# Patient Record
Sex: Female | Born: 1975 | Race: Black or African American | Hispanic: No | Marital: Single | Smoking: Current every day smoker
Health system: Southern US, Community
[De-identification: ages and names within clinical notes are randomized; demographics above are authoritative.]

## PROBLEM LIST (undated history)

## (undated) DIAGNOSIS — E119 Type 2 diabetes mellitus without complications: Secondary | ICD-10-CM

## (undated) DIAGNOSIS — I10 Essential (primary) hypertension: Secondary | ICD-10-CM

## (undated) DIAGNOSIS — K219 Gastro-esophageal reflux disease without esophagitis: Secondary | ICD-10-CM

## (undated) HISTORY — DX: Type 2 diabetes mellitus without complications: E11.9

---

## 2013-04-24 DIAGNOSIS — Z3490 Encounter for supervision of normal pregnancy, unspecified, unspecified trimester: Secondary | ICD-10-CM | POA: Insufficient documentation

## 2016-02-07 ENCOUNTER — Emergency Department (HOSPITAL_BASED_OUTPATIENT_CLINIC_OR_DEPARTMENT_OTHER)
Admission: EM | Admit: 2016-02-07 | Discharge: 2016-02-07 | Disposition: A | Discharge status: Home / Self Care | Payer: Managed Care, Other (non HMO) | Attending: Emergency Medicine | Admitting: Emergency Medicine

## 2016-02-07 ENCOUNTER — Encounter (HOSPITAL_BASED_OUTPATIENT_CLINIC_OR_DEPARTMENT_OTHER): Payer: Self-pay | Admitting: *Deleted

## 2016-02-07 DIAGNOSIS — R05 Cough: Secondary | ICD-10-CM | POA: Diagnosis present

## 2016-02-07 DIAGNOSIS — B9789 Other viral agents as the cause of diseases classified elsewhere: Secondary | ICD-10-CM

## 2016-02-07 DIAGNOSIS — J069 Acute upper respiratory infection, unspecified: Secondary | ICD-10-CM | POA: Diagnosis not present

## 2016-02-07 DIAGNOSIS — F17210 Nicotine dependence, cigarettes, uncomplicated: Secondary | ICD-10-CM | POA: Insufficient documentation

## 2016-02-07 MED ORDER — DM-GUAIFENESIN ER 30-600 MG PO TB12: 1.0000 | ORAL_TABLET | Freq: Two times a day (BID) | ORAL | 0 refills | Status: DC

## 2016-02-07 NOTE — ED Triage Notes (Signed)
Pt c/o URi symptoms x 3 days family at home with same

## 2016-02-07 NOTE — ED Provider Notes (Signed)
[facility]   CSN: [id number] Arrival date & time: 02/07/16  1814  By signing my name below, I, Majel HomerPeyton Lee, attest that this documentation has been prepared under the direction and in the presence of non-physician practitioner, Audry Piliyler Laprecious Austill, PA-C. Electronically Signed: Majel HomerPeyton Lee, Scribe. 02/07/2016. 8:48 PM.  History   Chief Complaint Chief Complaint  Patient presents with  . URI   The history is provided by the patient. No language interpreter was used.   HPI Comments: Kathleen Cabrera is a 40 y.o. female who presents to the Emergency Department complaining of gradually worsening, dry cough and congestion that began a few days ago. Pt reports she has not taken any medication to relieve her symptoms. She denies sore throat and fever.   History reviewed. No pertinent past medical history.  There are no active problems to display for this patient.  Past Surgical History:  Procedure Laterality Date  . CESAREAN SECTION      OB History    No data available     Home Medications    Prior to Admission medications   Not on File    Family History No family history on file.  Social History Social History  Substance Use Topics  . Smoking status: Current Every Day Smoker    Packs/day: 0.50    Types: Cigarettes  . Smokeless tobacco: Not on file  . Alcohol use No     Allergies   Patient has no known allergies.   Review of Systems Review of Systems  Constitutional: Negative for fever.  HENT: Positive for congestion. Negative for sore throat.   Respiratory: Positive for cough.    Physical Exam Updated Vital Signs BP (!) 163/107 (BP Location: Left Arm)   Pulse 97   Temp 98.6 F (37 C) (Oral)   Resp 18   Ht 5\' 3"  (1.6 m)   Wt 190 lb (86.2 kg)   SpO2 97%   BMI 33.66 kg/m   Physical Exam  Constitutional: She is oriented to person, place, and time. She appears well-developed and well-nourished. No distress.  HENT:  Head: Normocephalic  and atraumatic.  Right Ear: Tympanic membrane, external ear and ear canal normal.  Left Ear: Tympanic membrane, external ear and ear canal normal.  Nose: Nose normal.  Mouth/Throat: Uvula is midline, oropharynx is clear and moist and mucous membranes are normal. No trismus in the jaw. No oropharyngeal exudate, posterior oropharyngeal erythema or tonsillar abscesses.  Eyes: EOM are normal. Pupils are equal, round, and reactive to light.  Neck: Normal range of motion. Neck supple. No tracheal deviation present.  Cardiovascular: Normal rate, regular rhythm, S1 normal, S2 normal, normal heart sounds, intact distal pulses and normal pulses.   Pulmonary/Chest: Effort normal and breath sounds normal. No respiratory distress. She has no decreased breath sounds. She has no wheezes. She has no rhonchi. She has no rales.  Abdominal: Normal appearance and bowel sounds are normal. She exhibits no distension. There is no tenderness.  Musculoskeletal: Normal range of motion.  Neurological: She is alert and oriented to person, place, and time.  Skin: Skin is warm and dry.  Psychiatric: She has a normal mood and affect. Her speech is normal and behavior is normal. Thought content normal.  Nursing note and vitals reviewed.  ED Treatments / Results  Labs (all labs ordered are listed, but only abnormal results are displayed) Labs Reviewed - No data to display  EKG  EKG Interpretation None  Radiology No results found.  Procedures Procedures (including critical care time)  Medications Ordered in ED Medications - No data to display  DIAGNOSTIC STUDIES:  Oxygen Saturation is 97% on RA, normal by my interpretation.    COORDINATION OF CARE:  8:47 PM Discussed treatment plan with pt at bedside and pt agreed to plan.  Initial Impression / Assessment and Plan / ED Course  I have reviewed the triage vital signs and the nursing notes.  Pertinent labs & imaging results that were available during  my care of the patient were reviewed by me and considered in my medical decision making (see chart for details).  Clinical Course     Final Clinical Impressions(s) / ED Diagnoses   I obtained HPI from historian.  ED Course:  Assessment: Pt is a 40yM presents with URI symptoms x 2-3 days. Son here with same . On exam, pt in NAD. VSS. Afebrile. Lungs CTA, Heart RRR. Abdomen nontender/soft. Patients symptoms are consistent with URI, likely viral etiology. Discussed that antibiotics are not indicated for viral infections. Pt will be discharged with symptomatic treatment.  Verbalizes understanding and is agreeable with plan. Pt is hemodynamically stable & in NAD prior to dc.  Disposition/Plan:  DC Home Additional Verbal discharge instructions given and discussed with patient.  Pt Instructed to f/u with PCP in the next week for evaluation and treatment of symptoms. Return precautions given Pt acknowledges and agrees with plan  Supervising Physician Lyndal Pulleyaniel Knott, MD  Final diagnoses:  Viral URI with cough    New Prescriptions New Prescriptions   No medications on file     Audry Piliyler Peola Joynt, PA-C 02/07/16 2054    Lyndal Pulleyaniel Knott, MD 02/08/16 [phone]

## 2016-02-07 NOTE — Discharge Instructions (Signed)
Please read and follow all provided instructions.  Your diagnoses today include:  1. Viral URI with cough    You appear to have an upper respiratory infection (URI). An upper respiratory tract infection, or cold, is a viral infection of the air passages leading to the lungs. It should improve gradually after 5-7 days. You may have a lingering cough that lasts for 2- 4 weeks after the infection.  Tests performed today include: Vital signs. See below for your results today.   Medications prescribed:   Take any prescribed medications only as directed. Treatment for your infection is aimed at treating the symptoms. There are no medications, such as antibiotics, that will cure your infection.   Home care instructions:  Follow any educational materials contained in this packet.   Your illness is contagious and can be spread to others, especially during the first 3 or 4 days. It cannot be cured by antibiotics or other medicines. Take basic precautions such as washing your hands often, covering your mouth when you cough or sneeze, and avoiding public places where you could spread your illness to others.   Please continue drinking plenty of fluids.  Use over-the-counter medicines as needed as directed on packaging for symptom relief.  You may also use ibuprofen or tylenol as directed on packaging for pain or fever.  Do not take multiple medicines containing Tylenol or acetaminophen to avoid taking too much of this medication.  Follow-up instructions: Please follow-up with your primary care provider in the next 3 days for further evaluation of your symptoms if you are not feeling better.   Return instructions:  Please return to the Emergency Department if you experience worsening symptoms.  RETURN IMMEDIATELY IF you develop shortness of breath, confusion or altered mental status, a new rash, become dizzy, faint, or poorly responsive, or are unable to be cared for at home. Please return if you have  persistent vomiting and cannot keep down fluids or develop a fever that is not controlled by tylenol or motrin.   Please return if you have any other emergent concerns.  Additional Information:  Your vital signs today were: BP (!) 163/107 (BP Location: Left Arm)    Pulse 97    Temp 98.6 F (37 C) (Oral)    Resp 18    Ht 5\' 3"  (1.6 m)    Wt 86.2 kg    SpO2 97%    BMI 33.66 kg/m  If your blood pressure (BP) was elevated above 135/85 this visit, please have this repeated by your doctor within one month. --------------

## 2017-12-08 ENCOUNTER — Emergency Department (HOSPITAL_BASED_OUTPATIENT_CLINIC_OR_DEPARTMENT_OTHER)
Admission: EM | Admit: 2017-12-08 | Discharge: 2017-12-08 | Disposition: A | Discharge status: Home / Self Care | Payer: Managed Care, Other (non HMO) | Attending: Emergency Medicine | Admitting: Emergency Medicine

## 2017-12-08 ENCOUNTER — Other Ambulatory Visit: Payer: Self-pay

## 2017-12-08 ENCOUNTER — Encounter (HOSPITAL_BASED_OUTPATIENT_CLINIC_OR_DEPARTMENT_OTHER): Payer: Self-pay | Admitting: *Deleted

## 2017-12-08 ENCOUNTER — Emergency Department (HOSPITAL_BASED_OUTPATIENT_CLINIC_OR_DEPARTMENT_OTHER): Payer: Managed Care, Other (non HMO)

## 2017-12-08 DIAGNOSIS — F17210 Nicotine dependence, cigarettes, uncomplicated: Secondary | ICD-10-CM | POA: Insufficient documentation

## 2017-12-08 DIAGNOSIS — R0602 Shortness of breath: Secondary | ICD-10-CM | POA: Diagnosis present

## 2017-12-08 DIAGNOSIS — I10 Essential (primary) hypertension: Secondary | ICD-10-CM | POA: Insufficient documentation

## 2017-12-08 HISTORY — DX: Essential (primary) hypertension: I10

## 2017-12-08 LAB — URINALYSIS, ROUTINE W REFLEX MICROSCOPIC
Bilirubin Urine: NEGATIVE
GLUCOSE, UA: NEGATIVE mg/dL
HGB URINE DIPSTICK: NEGATIVE
Ketones, ur: NEGATIVE mg/dL
Leukocytes, UA: NEGATIVE
Nitrite: NEGATIVE
PROTEIN: NEGATIVE mg/dL
Specific Gravity, Urine: <1.005 — ABNORMAL LOW (ref 1.005–1.030)
pH: 7 (ref 5.0–8.0)

## 2017-12-08 LAB — CBC WITH DIFFERENTIAL/PLATELET
ABS IMMATURE GRANULOCYTES: 0.03 10*3/uL (ref 0.00–0.07)
BASOS PCT: 1 %
Basophils Absolute: 0.1 10*3/uL (ref 0.0–0.1)
EOS ABS: 0.3 10*3/uL (ref 0.0–0.5)
Eosinophils Relative: 3 %
HCT: 41.6 % (ref 36.0–46.0)
Hemoglobin: 14.3 g/dL (ref 12.0–15.0)
Immature Granulocytes: 0 %
Lymphocytes Relative: 46 %
Lymphs Abs: 4.6 10*3/uL — ABNORMAL HIGH (ref 0.7–4.0)
MCH: 34 pg (ref 26.0–34.0)
MCHC: 34.4 g/dL (ref 30.0–36.0)
MCV: 99 fL (ref 80.0–100.0)
MONO ABS: 0.5 10*3/uL (ref 0.1–1.0)
MONOS PCT: 5 %
NEUTROS ABS: 4.5 10*3/uL (ref 1.7–7.7)
Neutrophils Relative %: 45 %
PLATELETS: 312 10*3/uL (ref 150–400)
RBC: 4.2 MIL/uL (ref 3.87–5.11)
RDW: 13.2 % (ref 11.5–15.5)
WBC: 9.9 10*3/uL (ref 4.0–10.5)
nRBC: 0 % (ref 0.0–0.2)

## 2017-12-08 LAB — BASIC METABOLIC PANEL
ANION GAP: 13 (ref 5–15)
BUN: 8 mg/dL (ref 6–20)
CALCIUM: 9 mg/dL (ref 8.9–10.3)
CO2: 18 mmol/L — ABNORMAL LOW (ref 22–32)
Chloride: 104 mmol/L (ref 98–111)
Creatinine, Ser: 1.14 mg/dL — ABNORMAL HIGH (ref 0.44–1.00)
GFR calc Af Amer: >60 mL/min (ref >60)
GFR, EST NON AFRICAN AMERICAN: 58 mL/min — AB (ref >60)
GLUCOSE: 113 mg/dL — AB (ref 70–99)
POTASSIUM: 3 mmol/L — AB (ref 3.5–5.1)
SODIUM: 135 mmol/L (ref 135–145)

## 2017-12-08 LAB — PREGNANCY, URINE: PREG TEST UR: NEGATIVE

## 2017-12-08 MED ORDER — AMLODIPINE BESYLATE 10 MG PO TABS: 10.0000 mg | ORAL_TABLET | Freq: Every day | ORAL | 0 refills | Status: AC

## 2017-12-08 MED ORDER — LOSARTAN POTASSIUM 25 MG PO TABS: 25.0000 mg | ORAL_TABLET | Freq: Every day | ORAL | 0 refills | Status: AC

## 2017-12-08 MED ORDER — POTASSIUM CHLORIDE CRYS ER 20 MEQ PO TBCR
40.0000 meq | EXTENDED_RELEASE_TABLET | Freq: Once | ORAL | Status: AC
  Administered 2017-12-08: 40 meq via ORAL
  Filled 2017-12-08: qty 2

## 2017-12-08 NOTE — Discharge Instructions (Signed)
Take your blood pressure medications as prescribed. Schedule an appointment with your primary care provider to follow-up on your visit today.  It is also recommended you recheck your kidney function with your primary care doctor. Return to the ER for severe headache, vision changes, chest pain, worsening shortness of breath, or new or concerning symptoms.

## 2017-12-08 NOTE — ED Triage Notes (Signed)
Pt reports feeling SOB and dizzy. States "I can't catch my breath". Reports heart beating fast earlier. Tearful in triage. States she has been out of her BP meds for a while

## 2017-12-08 NOTE — ED Provider Notes (Addendum)
[facility] EMERGENCY DEPARTMENT Provider Note   CSN: [id number] Arrival date & time: 12/08/17  1830     History   Chief Complaint Chief Complaint  Patient presents with  . Shortness of Breath    HPI Kathleen Cabrera is a 42 y.o. female w PMHx HTN, presenting to the ED with complaint of SOB and dizziness. She states today she has been feeling intermittently lightheaded as though she may pass out. This occurs at rest. She states she also has had intermittent episodes of SOB where she feels like she can't catch her breath. She reports assoc palpitations and sometimes seeing "spots." States she has not taken BP medications in multiple months. Denies HA, diplopia or blurry vision, chest pain, cough, hemoptysis, abd pain, hx DVT/PE, LE swelling, recent travel or surgery. Gets depo shot, though no exogenous estrogens.   The history is provided by the patient.    Past Medical History:  Diagnosis Date  . Hypertension     There are no active problems to display for this patient.   Past Surgical History:  Procedure Laterality Date  . CESAREAN SECTION    . CESAREAN SECTION       OB History   None      Home Medications    Prior to Admission medications   Medication Sig Start Date End Date Taking? Authorizing Provider  amLODipine (NORVASC) 10 MG tablet Take 1 tablet (10 mg total) by mouth daily. 12/08/17   Robinson, [place] N, PA-C  dextromethorphan-guaiFENesin [facility] DM) 30-600 MG 12hr tablet Take 1 tablet by mouth 2 (two) times daily. 02/07/16   Audry Pili, PA-C  losartan (COZAAR) 25 MG tablet Take 1 tablet (25 mg total) by mouth daily. 12/08/17   Robinson, [place] N, PA-C    Family History No family history on file.  Social History Social History   Tobacco Use  . Smoking status: Current Every Day Smoker    Packs/day: 0.50    Types: Cigarettes  . Smokeless tobacco: Never Used  Substance Use Topics  . Alcohol use: Yes    Comment: weekly  . Drug use: No      Allergies   Patient has no known allergies.   Review of Systems Review of Systems  Constitutional: Negative for fever.  Respiratory: Positive for shortness of breath. Negative for cough.   Cardiovascular: Positive for palpitations. Negative for chest pain and leg swelling.  Gastrointestinal: Negative for abdominal pain, nausea and vomiting.  Neurological: Positive for light-headedness. Negative for headaches.  All other systems reviewed and are negative.    Physical Exam Updated Vital Signs BP (!) 173/115   Pulse 68   Resp 16   SpO2 100%   Physical Exam  Constitutional: She is oriented to person, place, and time. She appears well-developed and well-nourished.  HENT:  Head: Normocephalic and atraumatic.  Eyes: Pupils are equal, round, and reactive to light. Conjunctivae and EOM are normal.  Neck: Normal range of motion. Neck supple. No JVD present.  Cardiovascular: Normal rate, regular rhythm, normal heart sounds and intact distal pulses.  Pulmonary/Chest: Effort normal and breath sounds normal. No respiratory distress.  Abdominal: Soft. Bowel sounds are normal. She exhibits no distension and no mass. There is no tenderness. There is no guarding.  Musculoskeletal:  No LE edema.  Neurological: She is alert and oriented to person, place, and time.  Mental Status:  Alert, oriented, thought content appropriate, able to give a coherent history. Speech fluent without evidence of aphasia. Able to  follow 2 step commands without difficulty.  Cranial Nerves:  II:  Peripheral visual fields grossly normal, pupils equal, round, reactive to light III,IV, VI: ptosis not present, extra-ocular motions intact bilaterally  V,VII: smile symmetric, facial light touch sensation equal VIII: hearing grossly normal to voice  X: uvula elevates symmetrically  XI: bilateral shoulder shrug symmetric and strong XII: midline tongue extension without fassiculations Motor:  Normal tone. 5/5 in  upper and lower extremities bilaterally including strong and equal grip strength and dorsiflexion/plantar flexion Sensory: Pinprick and light touch normal in all extremities.  Deep Tendon Reflexes: 2+ and symmetric in the biceps and patella Cerebellar: normal finger-to-nose with bilateral upper extremities Gait: normal gait and balance CV: distal pulses palpable throughout    Skin: Skin is warm. She is not diaphoretic.  Psychiatric: She has a normal mood and affect. Her behavior is normal.  Nursing note and vitals reviewed.    ED Treatments / Results  Labs (all labs ordered are listed, but only abnormal results are displayed) Labs Reviewed  BASIC METABOLIC PANEL - Abnormal; Notable for the following components:      Result Value   Potassium 3.0 (*)    CO2 18 (*)    Glucose, Bld 113 (*)    Creatinine, Ser 1.14 (*)    GFR calc non Af Amer 58 (*)    All other components within normal limits  CBC WITH DIFFERENTIAL/PLATELET - Abnormal; Notable for the following components:   Lymphs Abs 4.6 (*)    All other components within normal limits  URINALYSIS, ROUTINE W REFLEX MICROSCOPIC - Abnormal; Notable for the following components:   Color, Urine COLORLESS (*)    Specific Gravity, Urine <1.005 (*)    All other components within normal limits  PREGNANCY, URINE    EKG EKG Interpretation  Date/Time:  Sunday December 08 2017 18:43:48 EDT Ventricular Rate:  73 PR Interval:    QRS Duration: 86 QT Interval:  380 QTC Calculation: 419 R Axis:   26 Text Interpretation:  Sinus rhythm Baseline wander in lead(s) I III aVL No old tracing to compare Confirmed by Loren Racer ([id number]) on 12/08/2017 8:43:56 PM   Radiology Dg Chest 2 View  Result Date: 12/08/2017 CLINICAL DATA:  Shortness of breath for 6 months. EXAM: CHEST - 2 VIEW COMPARISON:  None. FINDINGS: The heart size and mediastinal contours are within normal limits. Both lungs are clear. The visualized skeletal structures are  unremarkable. IMPRESSION: No active cardiopulmonary disease. Electronically Signed   By: Gerome Sam III M.D   On: 12/08/2017 20:00    Procedures Procedures (including critical care time)  Medications Ordered in ED Medications  potassium chloride SA (K-DUR,KLOR-CON) CR tablet 40 mEq (40 mEq Oral Given 12/08/17 2031)     Initial Impression / Assessment and Plan / ED Course  I have reviewed the triage vital signs and the nursing notes.  Pertinent labs & imaging results that were available during my care of the patient were reviewed by me and considered in my medical decision making (see chart for details).     Patient presenting with intermittent SOB and lightheadedness today. Noncompliant with BP medications. On arrival, pt noted to be anxious and hypertensive. No CP, HA, vision changes. Normal neuro exam. Lungs clear, normal work of breathing, O2 sat 100% on RA. Labs with mild hypokalemia, no EKG changes. Cr is mildly elevated, though patient's baseline is unknown. CBC unremarkable. U/A neg. Urine preg neg. CXR neg. PERC negative. BP improved in ED without  intervention and patient reporting improvement. Potassium replaced and recommend diet modifications to increase potassium. No signs of end organ damage. Discussed with patient the need for close follow-up and management by their primary care physician, and creatinine recheck. Well-appearing prior to discharge, and is agreeable to plan. Rx for BP medications given.   Patient discussed with Dr. Ranae Palms who agrees with care plan and discharge.  Discussed results, findings, treatment and follow up. Patient advised of return precautions. Patient verbalized understanding and agreed with plan.   Final Clinical Impressions(s) / ED Diagnoses   Final diagnoses:  Hypertension, unspecified type    ED Discharge Orders         Ordered    amLODipine (NORVASC) 10 MG tablet  Daily     12/08/17 2023    losartan (COZAAR) 25 MG tablet  Daily      12/08/17 2023           Robinson, [place] N, PA-C 12/08/17 2047    Robinson, [place] N, PA-C 12/08/17 2049    Loren Racer, MD 12/08/17 2330

## 2018-08-17 ENCOUNTER — Emergency Department (HOSPITAL_BASED_OUTPATIENT_CLINIC_OR_DEPARTMENT_OTHER): Payer: Managed Care, Other (non HMO)

## 2018-08-17 ENCOUNTER — Other Ambulatory Visit: Payer: Self-pay

## 2018-08-17 ENCOUNTER — Inpatient Hospital Stay (HOSPITAL_BASED_OUTPATIENT_CLINIC_OR_DEPARTMENT_OTHER)
Admission: EM | Admit: 2018-08-17 | Discharge: 2018-08-21 | DRG: 637 | Disposition: A | Discharge status: Home / Self Care | Payer: Managed Care, Other (non HMO) | Attending: Internal Medicine | Admitting: Internal Medicine

## 2018-08-17 ENCOUNTER — Encounter (HOSPITAL_BASED_OUTPATIENT_CLINIC_OR_DEPARTMENT_OTHER): Payer: Self-pay | Admitting: Emergency Medicine

## 2018-08-17 DIAGNOSIS — F10230 Alcohol dependence with withdrawal, uncomplicated: Secondary | ICD-10-CM | POA: Diagnosis present

## 2018-08-17 DIAGNOSIS — F17210 Nicotine dependence, cigarettes, uncomplicated: Secondary | ICD-10-CM | POA: Diagnosis present

## 2018-08-17 DIAGNOSIS — K529 Noninfective gastroenteritis and colitis, unspecified: Secondary | ICD-10-CM | POA: Diagnosis present

## 2018-08-17 DIAGNOSIS — E1110 Type 2 diabetes mellitus with ketoacidosis without coma: Secondary | ICD-10-CM | POA: Diagnosis present

## 2018-08-17 DIAGNOSIS — I10 Essential (primary) hypertension: Secondary | ICD-10-CM | POA: Diagnosis present

## 2018-08-17 DIAGNOSIS — R112 Nausea with vomiting, unspecified: Secondary | ICD-10-CM

## 2018-08-17 DIAGNOSIS — E876 Hypokalemia: Secondary | ICD-10-CM | POA: Diagnosis present

## 2018-08-17 DIAGNOSIS — K852 Alcohol induced acute pancreatitis without necrosis or infection: Secondary | ICD-10-CM | POA: Diagnosis present

## 2018-08-17 DIAGNOSIS — R739 Hyperglycemia, unspecified: Secondary | ICD-10-CM

## 2018-08-17 DIAGNOSIS — R197 Diarrhea, unspecified: Secondary | ICD-10-CM

## 2018-08-17 DIAGNOSIS — E869 Volume depletion, unspecified: Secondary | ICD-10-CM | POA: Diagnosis present

## 2018-08-17 DIAGNOSIS — K859 Acute pancreatitis without necrosis or infection, unspecified: Secondary | ICD-10-CM

## 2018-08-17 DIAGNOSIS — E119 Type 2 diabetes mellitus without complications: Secondary | ICD-10-CM

## 2018-08-17 DIAGNOSIS — N179 Acute kidney failure, unspecified: Secondary | ICD-10-CM | POA: Diagnosis present

## 2018-08-17 DIAGNOSIS — K219 Gastro-esophageal reflux disease without esophagitis: Secondary | ICD-10-CM | POA: Diagnosis present

## 2018-08-17 DIAGNOSIS — Z20828 Contact with and (suspected) exposure to other viral communicable diseases: Secondary | ICD-10-CM | POA: Diagnosis present

## 2018-08-17 DIAGNOSIS — F10930 Alcohol use, unspecified with withdrawal, uncomplicated: Secondary | ICD-10-CM

## 2018-08-17 HISTORY — DX: Gastro-esophageal reflux disease without esophagitis: K21.9

## 2018-08-17 LAB — POCT I-STAT EG7
Acid-base deficit: 22 mmol/L — ABNORMAL HIGH (ref 0.0–2.0)
Bicarbonate: 6.2 mmol/L — ABNORMAL LOW (ref 20.0–28.0)
Calcium, Ion: 1.22 mmol/L (ref 1.15–1.40)
HCT: 49 % — ABNORMAL HIGH (ref 36.0–46.0)
Hemoglobin: 16.7 g/dL — ABNORMAL HIGH (ref 12.0–15.0)
O2 Saturation: 47 %
Patient temperature: 98.6
Potassium: 5.3 mmol/L — ABNORMAL HIGH (ref 3.5–5.1)
Sodium: 139 mmol/L (ref 135–145)
TCO2: 7 mmol/L — ABNORMAL LOW (ref 22–32)
pCO2, Ven: 20.1 mmHg — ABNORMAL LOW (ref 44.0–60.0)
pH, Ven: 7.096 — CL (ref 7.250–7.430)
pO2, Ven: 34 mmHg (ref 32.0–45.0)

## 2018-08-17 LAB — CBG MONITORING, ED
Glucose-Capillary: 562 mg/dL (ref 70–99)
Glucose-Capillary: 442 mg/dL — ABNORMAL HIGH (ref 70–99)
Glucose-Capillary: 410 mg/dL — ABNORMAL HIGH (ref 70–99)
Glucose-Capillary: 236 mg/dL — ABNORMAL HIGH (ref 70–99)
Glucose-Capillary: 222 mg/dL — ABNORMAL HIGH (ref 70–99)

## 2018-08-17 LAB — MAGNESIUM: Magnesium: 2.3 mg/dL (ref 1.7–2.4)

## 2018-08-17 LAB — BASIC METABOLIC PANEL
Anion gap: 14 (ref 5–15)
BUN: 11 mg/dL (ref 6–20)
CO2: 10 mmol/L — ABNORMAL LOW (ref 22–32)
Calcium: 9.4 mg/dL (ref 8.9–10.3)
Chloride: 120 mmol/L — ABNORMAL HIGH (ref 98–111)
Creatinine, Ser: 0.98 mg/dL (ref 0.44–1.00)
GFR calc Af Amer: >60 mL/min (ref >60)
GFR calc non Af Amer: >60 mL/min (ref >60)
Glucose, Bld: 172 mg/dL — ABNORMAL HIGH (ref 70–99)
Potassium: 4.2 mmol/L (ref 3.5–5.1)
Sodium: 144 mmol/L (ref 135–145)

## 2018-08-17 LAB — PREGNANCY, URINE: Preg Test, Ur: NEGATIVE

## 2018-08-17 LAB — PHOSPHORUS: Phosphorus: 1.5 mg/dL — ABNORMAL LOW (ref 2.5–4.6)

## 2018-08-17 LAB — URINALYSIS, MICROSCOPIC (REFLEX)

## 2018-08-17 LAB — LACTIC ACID, PLASMA
Lactic Acid, Venous: 2.2 mmol/L (ref 0.5–1.9)
Lactic Acid, Venous: 1.9 mmol/L (ref 0.5–1.9)

## 2018-08-17 LAB — GLUCOSE, CAPILLARY
Glucose-Capillary: 162 mg/dL — ABNORMAL HIGH (ref 70–99)
Glucose-Capillary: 164 mg/dL — ABNORMAL HIGH (ref 70–99)
Glucose-Capillary: 170 mg/dL — ABNORMAL HIGH (ref 70–99)
Glucose-Capillary: 236 mg/dL — ABNORMAL HIGH (ref 70–99)

## 2018-08-17 LAB — CBC
HCT: 49.7 % — ABNORMAL HIGH (ref 36.0–46.0)
HCT: 45.6 % (ref 36.0–46.0)
Hemoglobin: 16.1 g/dL — ABNORMAL HIGH (ref 12.0–15.0)
Hemoglobin: 15.4 g/dL — ABNORMAL HIGH (ref 12.0–15.0)
MCH: 34.5 pg — ABNORMAL HIGH (ref 26.0–34.0)
MCH: 35.3 pg — ABNORMAL HIGH (ref 26.0–34.0)
MCHC: 32.4 g/dL (ref 30.0–36.0)
MCHC: 33.8 g/dL (ref 30.0–36.0)
MCV: 106.4 fL — ABNORMAL HIGH (ref 80.0–100.0)
MCV: 104.6 fL — ABNORMAL HIGH (ref 80.0–100.0)
Platelets: 299 10*3/uL (ref 150–400)
Platelets: 249 10*3/uL (ref 150–400)
RBC: 4.67 MIL/uL (ref 3.87–5.11)
RBC: 4.36 MIL/uL (ref 3.87–5.11)
RDW: 13.1 % (ref 11.5–15.5)
RDW: 13.1 % (ref 11.5–15.5)
WBC: 15.4 10*3/uL — ABNORMAL HIGH (ref 4.0–10.5)
WBC: 16.2 10*3/uL — ABNORMAL HIGH (ref 4.0–10.5)
nRBC: 0 % (ref 0.0–0.2)
nRBC: 0 % (ref 0.0–0.2)

## 2018-08-17 LAB — COMPREHENSIVE METABOLIC PANEL
ALT: 94 U/L — ABNORMAL HIGH (ref 0–44)
AST: 60 U/L — ABNORMAL HIGH (ref 15–41)
Albumin: 5.2 g/dL — ABNORMAL HIGH (ref 3.5–5.0)
Alkaline Phosphatase: 152 U/L — ABNORMAL HIGH (ref 38–126)
Anion gap: 25 — ABNORMAL HIGH (ref 5–15)
BUN: 22 mg/dL — ABNORMAL HIGH (ref 6–20)
CO2: 7 mmol/L — ABNORMAL LOW (ref 22–32)
Calcium: 10.7 mg/dL — ABNORMAL HIGH (ref 8.9–10.3)
Chloride: 101 mmol/L (ref 98–111)
Creatinine, Ser: 1.77 mg/dL — ABNORMAL HIGH (ref 0.44–1.00)
GFR calc Af Amer: 40 mL/min — ABNORMAL LOW (ref >60)
GFR calc non Af Amer: 35 mL/min — ABNORMAL LOW (ref >60)
Glucose, Bld: 751 mg/dL (ref 70–99)
Potassium: 5.4 mmol/L — ABNORMAL HIGH (ref 3.5–5.1)
Sodium: 133 mmol/L — ABNORMAL LOW (ref 135–145)
Total Bilirubin: 1.3 mg/dL — ABNORMAL HIGH (ref 0.3–1.2)
Total Protein: 9.6 g/dL — ABNORMAL HIGH (ref 6.5–8.1)

## 2018-08-17 LAB — URINALYSIS, ROUTINE W REFLEX MICROSCOPIC
Glucose, UA: >=500 mg/dL — AB
Ketones, ur: >80 mg/dL — AB
Leukocytes,Ua: NEGATIVE
Nitrite: NEGATIVE
Protein, ur: 100 mg/dL — AB
Specific Gravity, Urine: >1.03 — ABNORMAL HIGH (ref 1.005–1.030)
pH: 5.5 (ref 5.0–8.0)

## 2018-08-17 LAB — DIFFERENTIAL
Abs Immature Granulocytes: 0.08 10*3/uL — ABNORMAL HIGH (ref 0.00–0.07)
Basophils Absolute: 0 10*3/uL (ref 0.0–0.1)
Basophils Relative: 0 %
Eosinophils Absolute: 0.1 10*3/uL (ref 0.0–0.5)
Eosinophils Relative: 0 %
Immature Granulocytes: 1 %
Lymphocytes Relative: 16 %
Lymphs Abs: 2.6 10*3/uL (ref 0.7–4.0)
Monocytes Absolute: 1.2 10*3/uL — ABNORMAL HIGH (ref 0.1–1.0)
Monocytes Relative: 7 %
Neutro Abs: 12.3 10*3/uL — ABNORMAL HIGH (ref 1.7–7.7)
Neutrophils Relative %: 76 %

## 2018-08-17 LAB — MRSA PCR SCREENING: MRSA by PCR: NEGATIVE

## 2018-08-17 LAB — SARS CORONAVIRUS 2 AG (30 MIN TAT): SARS Coronavirus 2 Ag: NEGATIVE

## 2018-08-17 LAB — LIPASE, BLOOD: Lipase: 293 U/L — ABNORMAL HIGH (ref 11–51)

## 2018-08-17 LAB — HEMOGLOBIN A1C
Hgb A1c MFr Bld: 10.3 % — ABNORMAL HIGH (ref 4.8–5.6)
Mean Plasma Glucose: 248.91 mg/dL

## 2018-08-17 LAB — SARS CORONAVIRUS 2 BY RT PCR (HOSPITAL ORDER, PERFORMED IN ~~LOC~~ HOSPITAL LAB): SARS Coronavirus 2: NEGATIVE

## 2018-08-17 LAB — TROPONIN I (HIGH SENSITIVITY)
Troponin I (High Sensitivity): 5 ng/L (ref <18)
Troponin I (High Sensitivity): 5 ng/L (ref <18)

## 2018-08-17 MED ORDER — INSULIN GLARGINE 100 UNIT/ML ~~LOC~~ SOLN: 20.0000 [IU] | Freq: Every day | SUBCUTANEOUS | Status: DC

## 2018-08-17 MED ORDER — INSULIN REGULAR(HUMAN) IN NACL 100-0.9 UT/100ML-% IV SOLN
INTRAVENOUS | Status: DC
  Administered 2018-08-17: 22:00:00 4.4 [IU]/h via INTRAVENOUS
  Administered 2018-08-18: 7.4 [IU]/h via INTRAVENOUS
  Filled 2018-08-17: qty 100

## 2018-08-17 MED ORDER — LOSARTAN POTASSIUM 25 MG PO TABS
25.0000 mg | ORAL_TABLET | Freq: Once | ORAL | Status: AC
  Administered 2018-08-17: 18:00:00 25 mg via ORAL
  Filled 2018-08-17: qty 1

## 2018-08-17 MED ORDER — ENOXAPARIN SODIUM 40 MG/0.4ML ~~LOC~~ SOLN
40.0000 mg | Freq: Every day | SUBCUTANEOUS | Status: DC
  Administered 2018-08-17 – 2018-08-20 (×4): 40 mg via SUBCUTANEOUS
  Filled 2018-08-17 (×4): qty 0.4

## 2018-08-17 MED ORDER — DEXTROSE-NACL 5-0.45 % IV SOLN
INTRAVENOUS | Status: DC
  Administered 2018-08-17: 18:00:00 via INTRAVENOUS

## 2018-08-17 MED ORDER — SODIUM CHLORIDE 0.9 % IV BOLUS
1000.0000 mL | Freq: Once | INTRAVENOUS | Status: AC
  Administered 2018-08-17: 1000 mL via INTRAVENOUS

## 2018-08-17 MED ORDER — INSULIN REGULAR(HUMAN) IN NACL 100-0.9 UT/100ML-% IV SOLN
INTRAVENOUS | Status: DC
  Administered 2018-08-17: 15:00:00 5 [IU]/h via INTRAVENOUS
  Filled 2018-08-17: qty 100

## 2018-08-17 MED ORDER — SODIUM CHLORIDE 0.9% FLUSH
3.0000 mL | Freq: Once | INTRAVENOUS | Status: DC
  Filled 2018-08-17: qty 3

## 2018-08-17 MED ORDER — THIAMINE HCL 100 MG/ML IJ SOLN
100.0000 mg | Freq: Every day | INTRAMUSCULAR | Status: DC
  Administered 2018-08-17: 16:00:00 100 mg via INTRAVENOUS
  Filled 2018-08-17: qty 2

## 2018-08-17 MED ORDER — LORAZEPAM 2 MG/ML IJ SOLN
0.0000 mg | Freq: Two times a day (BID) | INTRAMUSCULAR | Status: DC
  Administered 2018-08-20: 2 mg via INTRAVENOUS
  Filled 2018-08-17: qty 1

## 2018-08-17 MED ORDER — SODIUM CHLORIDE 0.9 % IV SOLN: INTRAVENOUS | Status: AC

## 2018-08-17 MED ORDER — DEXTROSE-NACL 5-0.45 % IV SOLN
INTRAVENOUS | Status: DC
  Administered 2018-08-17: 22:00:00 via INTRAVENOUS

## 2018-08-17 MED ORDER — SODIUM CHLORIDE 0.9 % IV BOLUS
3000.0000 mL | Freq: Once | INTRAVENOUS | Status: AC
  Administered 2018-08-17: 3000 mL via INTRAVENOUS

## 2018-08-17 MED ORDER — AMLODIPINE BESYLATE 5 MG PO TABS
10.0000 mg | ORAL_TABLET | Freq: Once | ORAL | Status: AC
  Administered 2018-08-17: 18:00:00 10 mg via ORAL
  Filled 2018-08-17: qty 2

## 2018-08-17 MED ORDER — LORAZEPAM 2 MG/ML IJ SOLN
0.0000 mg | Freq: Four times a day (QID) | INTRAMUSCULAR | Status: AC
  Administered 2018-08-17: 14:00:00 1 mg via INTRAVENOUS
  Administered 2018-08-19: 12:00:00 2 mg via INTRAVENOUS
  Filled 2018-08-17 (×2): qty 1

## 2018-08-17 MED ORDER — ALUM & MAG HYDROXIDE-SIMETH 200-200-20 MG/5ML PO SUSP
30.0000 mL | Freq: Once | ORAL | Status: AC
Start: 2018-08-17 — End: 2018-08-17
  Administered 2018-08-17: 16:00:00 30 mL via ORAL
  Filled 2018-08-17: qty 30

## 2018-08-17 MED ORDER — LORAZEPAM 1 MG PO TABS: 0.0000 mg | ORAL_TABLET | Freq: Two times a day (BID) | ORAL | Status: DC

## 2018-08-17 MED ORDER — AMLODIPINE BESYLATE 10 MG PO TABS
10.0000 mg | ORAL_TABLET | Freq: Every day | ORAL | Status: DC
  Administered 2018-08-17 – 2018-08-21 (×5): 10 mg via ORAL
  Filled 2018-08-17 (×5): qty 1

## 2018-08-17 MED ORDER — POTASSIUM CHLORIDE 10 MEQ/100ML IV SOLN
10.0000 meq | INTRAVENOUS | Status: AC
  Administered 2018-08-17 (×2): 10 meq via INTRAVENOUS
  Filled 2018-08-17 (×2): qty 100

## 2018-08-17 MED ORDER — HYDROMORPHONE HCL 1 MG/ML IJ SOLN
0.5000 mg | INTRAMUSCULAR | Status: DC | PRN
  Administered 2018-08-18 – 2018-08-20 (×7): 0.5 mg via INTRAVENOUS
  Filled 2018-08-17 (×7): qty 0.5

## 2018-08-17 MED ORDER — LIDOCAINE VISCOUS HCL 2 % MT SOLN
15.0000 mL | Freq: Once | OROMUCOSAL | Status: AC
  Administered 2018-08-17: 16:00:00 15 mL via ORAL
  Filled 2018-08-17: qty 15

## 2018-08-17 MED ORDER — CHLORHEXIDINE GLUCONATE CLOTH 2 % EX PADS
6.0000 | MEDICATED_PAD | Freq: Every day | CUTANEOUS | Status: DC
  Administered 2018-08-17 – 2018-08-20 (×3): 6 via TOPICAL

## 2018-08-17 MED ORDER — LORAZEPAM 2 MG/ML IJ SOLN: 1.0000 mg | Freq: Once | INTRAMUSCULAR | Status: DC

## 2018-08-17 MED ORDER — VITAMIN B-1 100 MG PO TABS
100.0000 mg | ORAL_TABLET | Freq: Every day | ORAL | Status: DC
  Administered 2018-08-18 – 2018-08-21 (×4): 100 mg via ORAL
  Filled 2018-08-17 (×4): qty 1

## 2018-08-17 MED ORDER — INSULIN ASPART 100 UNIT/ML ~~LOC~~ SOLN: 0.0000 [IU] | SUBCUTANEOUS | Status: DC

## 2018-08-17 MED ORDER — STERILE WATER FOR INJECTION IV SOLN
INTRAVENOUS | Status: DC
  Administered 2018-08-17: 22:00:00 via INTRAVENOUS
  Filled 2018-08-17 (×3): qty 850

## 2018-08-17 MED ORDER — SODIUM CHLORIDE 0.9 % IV SOLN: INTRAVENOUS | Status: DC

## 2018-08-17 MED ORDER — ONDANSETRON HCL 4 MG/2ML IJ SOLN
4.0000 mg | Freq: Once | INTRAMUSCULAR | Status: AC
  Administered 2018-08-17: 4 mg via INTRAVENOUS
  Filled 2018-08-17: qty 2

## 2018-08-17 MED ORDER — LORAZEPAM 1 MG PO TABS: 0.0000 mg | ORAL_TABLET | Freq: Four times a day (QID) | ORAL | Status: AC

## 2018-08-17 NOTE — ED Notes (Signed)
cbg [id number]

## 2018-08-17 NOTE — Progress Notes (Signed)
RT note: Reported VBG results of ph 7.09/PCO2 20.1/PAO 37/HCO3 6.2 RBV to DR [facility].

## 2018-08-17 NOTE — ED Notes (Signed)
Lactic Acid 2.2, results given to ED PA

## 2018-08-17 NOTE — ED Notes (Signed)
ED TO INPATIENT HANDOFF REPORT  ED Nurse Name and Phone #: Joss  S Name/Age/Gender Kathleen Cabrera 43 y.o. female Room/Bed: MH10/MH10  Code Status   Code Status: Full Code  Home/SNF/Other Home Patient oriented to: self, place, time and situation Is this baseline? Yes   Triage Complete: Triage complete  Chief Complaint SOB, Flank Pain, Vomiting, Elevated BP, Acid Reflux  Triage Note SOB and vomiting since yesterday. Reports normally drinks daily but decided to quit 2 days ago.    Allergies No Known Allergies  Level of Care/Admitting Diagnosis ED Disposition    ED Disposition Condition Comment   Admit  [facility] [[id number]]  Level of Care: Stepdown [14]  Admit to SDU based on following criteria: Hemodynamic compromise or significant risk of instability:  Patient requiring short term acute titration and management of vasoactive drips, and invasive monitoring (i.e., CVP and Arterial line).  Admit to SDU based on following criteria: Severe physiological/psychological symptoms:  Any diagnosis requiring assessment & intervention at least every 4 hours on an ongoing basis to obtain desired patient outcomes including stability and rehabilitation  Covid Evaluation: Asymptomatic Screening Protocol (No Symptoms)  Diagnosis: DKA (diabetic ketoacidoses) [facility](HCC) [[id number]]) [[id number]]  Admitting Physician: Zigmund DanielPOWELL JR, A CALDWELL [phone][[id number]]  Attending Physician: Shaune SpittlePOWELL JR, A CALDWELL [phone][[id number]]  Estimated length of stay: past midnight tomorrow  Certification:: I certify this patient will need inpatient services for at least 2 midnights  PT Class (Do Not Modify): Inpatient [101]  PT Acc Code (Do Not Modify): Private [1]       B Medical/Surgery History Past Medical History:  Diagnosis Date  . GERD (gastroesophageal reflux disease)   . Hypertension    Past Surgical History:  Procedure Laterality Date  . CESAREAN SECTION    . CESAREAN SECTION       A IV  Location/Drains/Wounds Patient Lines/Drains/Airways Status   Active Line/Drains/Airways    Name:   Placement date:   Placement time:   Site:   Days:   Peripheral IV 08/17/18 Right Antecubital   08/17/18    1309    Antecubital   less than 1   Peripheral IV 08/17/18 Left Antecubital   08/17/18    1535    Antecubital   less than 1          Intake/Output Last 24 hours  Intake/Output Summary (Last 24 hours) at 08/17/2018 1801 Last data filed at 08/17/2018 1516 Gross per 24 hour  Intake 1000 ml  Output 375 ml  Net 625 ml    Labs/Imaging Results for orders placed or performed during the hospital encounter of 08/17/18 (from the past 48 hour(s))  Troponin I (High Sensitivity)     Status: None   Collection Time: 08/17/18  1:10 PM  Result Value Ref Range   Troponin I (High Sensitivity) 5 <18 ng/L    Comment: (NOTE) Elevated high sensitivity troponin I (hsTnI) values and significant  changes across serial measurements may suggest ACS but many other  chronic and acute conditions are known to elevate hsTnI results.  Refer to the "Links" section for chest pain algorithms and additional  guidance. Performed at [facility] [place], [address] [place], [place] [address]   Lipase, blood     Status: Abnormal   Collection Time: 08/17/18  1:11 PM  Result Value Ref Range   Lipase 293 (H) 11 - 51 U/L    Comment: Performed at [facility] [place], [address] [place], [place]  [address]  Comprehensive metabolic panel     Status: Abnormal   Collection Time: 08/17/18  1:11 PM  Result Value Ref Range   Sodium 133 (L) 135 - 145 mmol/L   Potassium 5.4 (H) 3.5 - 5.1 mmol/L   Chloride 101 98 - 111 mmol/L   CO2 7 (L) 22 - 32 mmol/L   Glucose, Bld 751 (HH) 70 - 99 mg/dL    Comment: CRITICAL RESULT CALLED TO, READ BACK BY AND VERIFIED WITH: MISTY DAVIS RN  08/17/2018  OLSONM    BUN 22 (H) 6 - 20 mg/dL   Creatinine, Ser 6.04 (H) 0.44 - 1.00 mg/dL   Calcium 54.0 (H) 8.9 - 10.3 mg/dL    Total Protein 9.6 (H) 6.5 - 8.1 g/dL   Albumin 5.2 (H) 3.5 - 5.0 g/dL   AST 60 (H) 15 - 41 U/L   ALT 94 (H) 0 - 44 U/L   Alkaline Phosphatase 152 (H) 38 - 126 U/L   Total Bilirubin 1.3 (H) 0.3 - 1.2 mg/dL   GFR calc non Af Amer 35 (L) >60 mL/min   GFR calc Af Amer 40 (L) >60 mL/min   Anion gap 25 (H) 5 - 15    Comment: Performed at [facility], [address] [place], [place] [address]  CBC     Status: Abnormal   Collection Time: 08/17/18  1:11 PM  Result Value Ref Range   WBC 15.4 (H) 4.0 - 10.5 K/uL   RBC 4.67 3.87 - 5.11 MIL/uL   Hemoglobin 16.1 (H) 12.0 - 15.0 g/dL   HCT 14.7 (H) 82.9 - 56.2 %   MCV 106.4 (H) 80.0 - 100.0 fL   MCH 34.5 (H) 26.0 - 34.0 pg   MCHC 32.4 30.0 - 36.0 g/dL   RDW 13.0 86.5 - 78.4 %   Platelets 299 150 - 400 K/uL   nRBC 0.0 0.0 - 0.2 %    Comment: Performed at [facility], [address] [place], [place] [address]  Hemoglobin A1c     Status: Abnormal   Collection Time: 08/17/18  1:11 PM  Result Value Ref Range   Hgb A1c MFr Bld 10.3 (H) 4.8 - 5.6 %    Comment: (NOTE) Pre diabetes:          5.7%-6.4% Diabetes:              >6.4% Glycemic control for   <7.0% adults with diabetes    Mean Plasma Glucose 248.91 mg/dL    Comment: Performed at [facility], [address] [place] [address], [place], [place] [address]  Urinalysis, Routine w reflex microscopic     Status: Abnormal   Collection Time: 08/17/18  1:48 PM  Result Value Ref Range   Color, Urine YELLOW YELLOW   APPearance CLEAR CLEAR   Specific Gravity, Urine >1.030 (H) 1.005 - 1.030   pH 5.5 5.0 - 8.0   Glucose, UA >=500 (A) NEGATIVE mg/dL   Hgb urine dipstick MODERATE (A) NEGATIVE   Bilirubin Urine SMALL (A) NEGATIVE   Ketones, ur >80 (A) NEGATIVE mg/dL   Protein, ur 324 (A) NEGATIVE mg/dL   Nitrite NEGATIVE NEGATIVE   Leukocytes,Ua NEGATIVE NEGATIVE    Comment: Performed at [facility], [address] [place], [place] [address]  Pregnancy, urine      Status: None   Collection Time: 08/17/18  1:48 PM  Result Value Ref Range   Preg Test, Ur NEGATIVE NEGATIVE  Comment:        THE SENSITIVITY OF THIS METHODOLOGY IS >20 mIU/mL. Performed at [facility], [address] [place], [place] [address]   Urinalysis, Microscopic (reflex)     Status: Abnormal   Collection Time: 08/17/18  1:48 PM  Result Value Ref Range   RBC / HPF 6-10 0 - 5 RBC/hpf   WBC, UA 6-10 0 - 5 WBC/hpf   Bacteria, UA FEW (A) NONE SEEN   Squamous Epithelial / LPF 11-20 0 - 5   Mucus PRESENT    Hyaline Casts, UA PRESENT    Granular Casts, UA PRESENT     Comment: Performed at [facility], [address] [place], [place] [address]  CBG monitoring, ED     Status: Abnormal   Collection Time: 08/17/18  2:41 PM  Result Value Ref Range   Glucose-Capillary 562 (HH) 70 - 99 mg/dL   Comment 1 Notify RN    Comment 2 Call MD NNP PA CNM   SARS Coronavirus 2 (Hosp order,Performed in [place] lab via [employer] ID)     Status: None   Collection Time: 08/17/18  3:02 PM   Specimen: Dry Nasal Swab ([employer] ID Now)  Result Value Ref Range   SARS Coronavirus 2 ([employer] ID Now) NEGATIVE NEGATIVE    Comment: (NOTE) SARS-CoV-2 target nucleic acids are NOT DETECTED. The SARS-CoV-2 RNA is generally detectable in upper and lower respiratory specimens during the acute phase of infection.  Negativeresults do not preclude SARS-CoV-2 infection, do not rule out coinfections with other pathogens, and should not be used as the  sole basis for treatment or other patient management decisions.  Negative results must be combined with clinical observations, patient history, and epidemiological information. The expected result is Negative. Fact Sheet for Patients: GolfingFamily.no Fact Sheet for Healthcare Providers: https://www.hernandez-brewer.com/ This test is not yet approved or cleared by the [place] FDA and  has been authorized  for detection and/or diagnosis of SARS-CoV-2 by FDA under an Emergency Use Authorization (EUA).  This EUA will remain in effect (meaning this test can be used) for the duration of  the COVID19 declaration under Section 5 64(b)(1) of the Act, 21 U.S.C.  section [phone] 3(b)(1), unless the authorization is terminated or revoked sooner. Performed at [facility], [address] [place], [place] [address]   POCT I-Stat EG7     Status: Abnormal   Collection Time: 08/17/18  3:36 PM  Result Value Ref Range   pH, Ven 7.096 (LL) 7.250 - 7.430   pCO2, Ven 20.1 (L) 44.0 - 60.0 mmHg   pO2, Ven 34.0 32.0 - 45.0 mmHg   Bicarbonate 6.2 (L) 20.0 - 28.0 mmol/L   TCO2 7 (L) 22 - 32 mmol/L   O2 Saturation 47.0 %   Acid-base deficit 22.0 (H) 0.0 - 2.0 mmol/L   Sodium 139 135 - 145 mmol/L   Potassium 5.3 (H) 3.5 - 5.1 mmol/L   Calcium, Ion 1.22 1.15 - 1.40 mmol/L   HCT 49.0 (H) 36.0 - 46.0 %   Hemoglobin 16.7 (H) 12.0 - 15.0 g/dL   Patient temperature 98.6 F    Collection site IV START    Drawn by Operator    Sample type VENOUS    Comment NOTIFIED PHYSICIAN   CBG monitoring, ED     Status: Abnormal   Collection Time: 08/17/18  3:57 PM  Result Value Ref Range   Glucose-Capillary 442 (H) 70 -  99 mg/dL  CBG monitoring, ED     Status: Abnormal   Collection Time: 08/17/18  5:02 PM  Result Value Ref Range   Glucose-Capillary 410 (H) 70 - 99 mg/dL  Troponin I (High Sensitivity)     Status: None   Collection Time: 08/17/18  5:08 PM  Result Value Ref Range   Troponin I (High Sensitivity) 5 <18 ng/L    Comment: (NOTE) Elevated high sensitivity troponin I (hsTnI) values and significant  changes across serial measurements may suggest ACS but many other  chronic and acute conditions are known to elevate hsTnI results.  Refer to the "Links" section for chest pain algorithms and additional  guidance. Performed at [facility] [place], [address] [place], [place] [address]   Lactic  acid, plasma     Status: Abnormal   Collection Time: 08/17/18  5:09 PM  Result Value Ref Range   Lactic Acid, Venous 2.2 (HH) 0.5 - 1.9 mmol/L    Comment: CRITICAL RESULT CALLED TO, READ BACK BY AND VERIFIED WITH: Bryna ColanderSOPHIE GOUGE RN @1734  08/17/2018 OLSONM Performed at [facility] [place], [address] [place], [place] [address]    Ct Abdomen Pelvis Wo Contrast  Result Date: 08/17/2018 CLINICAL DATA:  epigastric pain with nausea and vomiting since Thursday. Quit drinking 2 days ago. Elevated creatinine. EXAM: CT ABDOMEN AND PELVIS WITHOUT CONTRAST TECHNIQUE: Multidetector CT imaging of the abdomen and pelvis was performed following the standard protocol without IV contrast. COMPARISON:  None. FINDINGS: Lower chest: Clear lung bases. Normal heart size without pericardial or pleural effusion. Hepatobiliary: Marked hepatic steatosis. Normal gallbladder, without biliary ductal dilatation. Pancreas: Mild pancreatic soft tissue fullness throughout with possible subtle peripancreatic edema. No duct dilatation or well-defined peripancreatic fluid collection. Spleen: Normal in size, without focal abnormality. Adrenals/Urinary Tract: Normal adrenal glands. Interpolar punctate right renal collecting system calculus. No left renal calculi or hydronephrosis. No bladder calculi. Stomach/Bowel: Normal stomach, without wall thickening. Normal colon, appendix, and terminal ileum. Normal small bowel. No free intraperitoneal air. Vascular/Lymphatic: Aortic atherosclerosis. No abdominopelvic adenopathy. Reproductive: Normal uterus and adnexa. Other: No significant free fluid. Musculoskeletal: No acute osseous abnormality. Mild disc bulges at L4-5 and L5-S1. IMPRESSION: 1. Suggestion of pancreatic soft tissue fullness and subtle peripancreatic edema. Correlate with laboratory values to exclude mild pancreatitis. 2. No other explanation for patient's symptoms. 3. Marked hepatic steatosis. 4.  Aortic Atherosclerosis  (ICD10-I70.0).  This is age advanced. 5. Right nephrolithiasis. Electronically Signed   By: Jeronimo GreavesKyle  Talbot M.D.   On: 08/17/2018 16:57   Dg Chest Portable 1 View  Result Date: 08/17/2018 CLINICAL DATA:  Shortness of breath and vomiting since yesterday. EXAM: PORTABLE CHEST 1 VIEW COMPARISON:  12/08/2017, report only. FINDINGS: Numerous leads and wires project over the chest. Midline trachea. Normal heart size and mediastinal contours. No pleural effusion or pneumothorax. Clear lungs. IMPRESSION: Normal chest. Electronically Signed   By: Jeronimo GreavesKyle  Talbot M.D.   On: 08/17/2018 13:24    Pending Labs Unresulted Labs (From admission, onward)    Start     Ordered   08/17/18 1618  Lactic acid, plasma  Now then every 2 hours,   STAT     08/17/18 1617          Vitals/Pain Today's Vitals   08/17/18 1637 08/17/18 1700 08/17/18 1721 08/17/18 1730  BP:  (!) 165/116 (!) 165/116 (!) 172/109  Pulse: (!) 127 (!) 127 (!) 121 (!) 126  Resp: (!) 26 (!) 30  (!)  29  Temp:      TempSrc:      SpO2: 100% 100%  100%  Weight:      Height:      PainSc:        Isolation Precautions No active isolations  Medications Medications  LORazepam (ATIVAN) injection 0-4 mg (1 mg Intravenous Given 08/17/18 1338)    Or  LORazepam (ATIVAN) tablet 0-4 mg ( Oral See Alternative 08/17/18 1338)  LORazepam (ATIVAN) injection 0-4 mg (has no administration in time range)    Or  LORazepam (ATIVAN) tablet 0-4 mg (has no administration in time range)  thiamine (VITAMIN B-1) tablet 100 mg ( Oral See Alternative 08/17/18 1538)    Or  thiamine (B-1) injection 100 mg (100 mg Intravenous Given 08/17/18 1538)  dextrose 5 %-0.45 % sodium chloride infusion (has no administration in time range)  insulin regular, human (MYXREDLIN) 100 units/ 100 mL infusion (7 Units/hr Intravenous Rate/Dose Change 08/17/18 1707)  ondansetron (ZOFRAN) injection 4 mg (4 mg Intravenous Given 08/17/18 1336)  alum & mag hydroxide-simeth (MAALOX/MYLANTA) 200-200-20  MG/5ML suspension 30 mL (30 mLs Oral Given 08/17/18 1533)    And  lidocaine (XYLOCAINE) 2 % viscous mouth solution 15 mL (15 mLs Oral Given 08/17/18 1533)  sodium chloride 0.9 % bolus 1,000 mL (0 mLs Intravenous Stopped 08/17/18 1440)  sodium chloride 0.9 % bolus 3,000 mL ( Intravenous Restarted 08/17/18 1715)  amLODipine (NORVASC) tablet 10 mg (10 mg Oral Given 08/17/18 1753)  losartan (COZAAR) tablet 25 mg (25 mg Oral Given 08/17/18 1753)    Mobility walks with person assist Low fall risk   Focused Assessments Cardiac Assessment Handoff:    No results found for: CKTOTAL, CKMB, CKMBINDEX, TROPONINI No results found for: DDIMER Does the Patient currently have chest pain? No      R Recommendations: See Admitting Provider Note  Report given to:   Additional Notes:

## 2018-08-17 NOTE — ED Triage Notes (Addendum)
SOB and vomiting since yesterday. Reports normally drinks daily but decided to quit 2 days ago.

## 2018-08-17 NOTE — ED Notes (Signed)
Assisted to use bedside commode.  Heart rate elevates with exertion to 130's.  Comes back down after rest.

## 2018-08-17 NOTE — ED Notes (Signed)
[facility] here to get patient.  Insulin drip adjusted.  Patient belonging bagged at the bedside ready for transport.

## 2018-08-17 NOTE — ED Notes (Signed)
ED Provider at bedside. 

## 2018-08-17 NOTE — ED Notes (Signed)
cbg [id number]

## 2018-08-17 NOTE — H&P (Signed)
History and Physical  Kathleen Cabrera ZOX:[id number] DOB: 20-Sep-1975 DOA: 08/17/2018  Referring physician[facility]. [facility]  Outpatient Specialists:    [facility]  Chief Complaint: Abdominal pain, nausea, vomiting and diarrhea.  HPI:  Patient is a 43 year old African-American female with past medical history significant for alcohol abuse, hypertension, GERD and chronic diarrhea.  According to the patient, she has had diarrhea for about the month.  Patient reports having 3-4 episodes of loose, watery stools.  Patient developed nausea, vomiting and abdominal pain 2-3 days ago.  Pain is mainly around the epigastric area.  Patient reports daily use of liquor up until 2 to 3 days ago.  No headache, no neck pain, no fever or chills, no urinary symptoms.  ER note also documented that patient was short of breath.  Presentation to the hospital, temperature was 97.5, heart rate ranged from 120 to 142 bpm, respiratory rate of 29 and blood pressure of 146-185/102-113 mmHg, and O2 sat of 100%.  VBG done revealed pH of 7.096, PCO2 of 20.1 and PO2 of 34.  Done on presentation revealed sodium of 133, potassium of 5.4, blood sugar of 751, anion gap of 25, hemoglobin A1c of 10.3%, CO2 of 7 (CO2 8 months ago was 18), BUN of 22, creatinine of 1.77 (baseline is 1.14), lactic acid of 2.2 and lipase of 293.  WBC is 15.5, hemoglobin of 16.1.  UA revealed urine glucoses greater than 500, ketones greater than 80 and urine specific gravity greater than 1.030.  Urine microscopy reveals few bacteria, 6-10 urine WBC.  [facility] [place] [facility] was negative.  [facility] under the hospitalist service for further assessment and management.  ED Course: Kindly refer to above documentation.  Vitals on presentation as documented above.  Patient is currently on treatment for DKA and  alcohol withdrawal.  [facility] for further assessment and management.   Pertinent labs: VBG on presentation revealed pH of 7.096, PCO2 of 20, and PO2 of 34.  On presentation revealed sodium of 133, potassium of 5.4, chloride 101, CO2 of 7, BUN of 22, creatinine of 1.77, anion gap of 25 and blood sugar of 751.  Thick acid on presentation was 2.2.  HbA1c is 10.3%.  CBC reveals WBC of 15.4, hemoglobin of 16.1, hematocrit of 49.7, MCV of 106.4 and platelet count of 299.  UA is as documented above.  Imaging studies as documented below.  EKG: Independently reviewed.  There with low voltage EKG in the precordial leads.  Imaging: independently reviewed.  Chest x-ray has not revealed any acute cardiopulmonary abnormality.  CT scan of the abdomen and pelvis without contrast revealed findings suggestive of pancreatic soft tissue fullness and subtle peripancreatic edema, marked hepatic steatosis and aortic atherosclerosis and right nephrolithiasis.  Review of Systems:  Negative for fever, visual changes, sore throat, rash, new muscle aches, chest pain, dysuria, bleeding.  Past Medical History:  Diagnosis Date   GERD (gastroesophageal reflux disease)    Hypertension     Past Surgical History:  Procedure Laterality Date   CESAREAN SECTION     CESAREAN SECTION       reports that she has been smoking cigarettes. She has been smoking about 0.50 packs per day. She has never used smokeless tobacco. She reports current alcohol use. She reports that she does not use  drugs.  No Known Allergies  No family history on file.   Prior to Admission medications   Medication Sig Start Date End Date Taking? Authorizing Provider  amLODipine (NORVASC) 10 MG tablet Take 1 tablet (10 mg total) by mouth daily. 12/08/17   Robinson, [place] N, PA-C  dextromethorphan-guaiFENesin [facility](MUCINEX DM) 30-600 MG 12hr tablet Take 1 tablet by mouth 2 (two) times daily. 02/07/16   Audry PiliMohr,  Tyler, PA-C  losartan (COZAAR) 25 MG tablet Take 1 tablet (25 mg total) by mouth daily. 12/08/17   Robinson, [place] N, PA-C    Physical Exam: Vitals:   08/17/18 1830 08/17/18 1832 08/17/18 1900 08/17/18 2000  BP: (!) 159/103  (!) 154/103 (!) 156/93  Pulse: (!) 126  (!) 121   Resp:      Temp:  (!) 97.5 F (36.4 C)    TempSrc:  Oral    SpO2: 100%  100%   Weight:      Height:        Constitutional:   Appears calm and comfortable Eyes:   No pallor. No jaundice.  ENMT:   external ears, nose appear normal Neck:   Neck is supple. No JVD Respiratory:   CTA bilaterally, no w/r/r.   Respiratory effort normal. No retractions or accessory muscle use Cardiovascular:   S1S2, tachycardia  No LE extremity edema   Abdomen:   Abdomen is obese, soft and non tender. Organs are difficult to assess. Neurologic:   Awake and alert.  Moves all limbs.  Wt Readings from Last 3 Encounters:  08/17/18 81.6 kg  02/07/16 86.2 kg    I have personally reviewed following labs and imaging studies  Labs on Admission:  CBC: Recent Labs  Lab 08/17/18 1311 08/17/18 1536  WBC 15.4*  --   HGB 16.1* 16.7*  HCT 49.7* 49.0*  MCV 106.4*  --   PLT 299  --    Basic Metabolic Panel: Recent Labs  Lab 08/17/18 1311 08/17/18 1536  NA 133* 139  K 5.4* 5.3*  CL 101  --   CO2 7*  --   GLUCOSE 751*  --   BUN 22*  --   CREATININE 1.77*  --   CALCIUM 10.7*  --    Liver Function Tests: Recent Labs  Lab 08/17/18 1311  AST 60*  ALT 94*  ALKPHOS 152*  BILITOT 1.3*  PROT 9.6*  ALBUMIN 5.2*   Recent Labs  Lab 08/17/18 1311  LIPASE 293*   No results for input(s): AMMONIA in the last 168 hours. Coagulation Profile: No results for input(s): INR, PROTIME in the last 168 hours. Cardiac Enzymes: No results for input(s): CKTOTAL, CKMB, CKMBINDEX, TROPONINI in the last 168 hours. BNP (last 3 results) No results for input(s): PROBNP in the last 8760 hours. HbA1C: Recent Labs     08/17/18 1311  HGBA1C 10.3*   CBG: Recent Labs  Lab 08/17/18 1557 08/17/18 1702 08/17/18 1809 08/17/18 1914 08/17/18 2010  GLUCAP 442* 410* 236* 222* 236*   Lipid Profile: No results for input(s): CHOL, HDL, LDLCALC, TRIG, CHOLHDL, LDLDIRECT in the last 72 hours. Thyroid Function Tests: No results for input(s): TSH, T4TOTAL, FREET4, T3FREE, THYROIDAB in the last 72 hours. Anemia Panel: No results for input(s): VITAMINB12, FOLATE, FERRITIN, TIBC, IRON, RETICCTPCT in the last 72 hours. Urine analysis:    Component Value Date/Time   COLORURINE YELLOW 08/17/2018 1348   APPEARANCEUR CLEAR 08/17/2018 1348   LABSPEC >1.030 (H) 08/17/2018 1348   PHURINE 5.5 08/17/2018 1348  GLUCOSEU >=500 (A) 08/17/2018 1348   HGBUR MODERATE (A) 08/17/2018 1348   BILIRUBINUR SMALL (A) 08/17/2018 1348   KETONESUR >80 (A) 08/17/2018 1348   PROTEINUR 100 (A) 08/17/2018 1348   NITRITE NEGATIVE 08/17/2018 1348   LEUKOCYTESUR NEGATIVE 08/17/2018 1348   Sepsis Labs: @LABRCNTIP (procalcitonin:4,lacticidven:4) ) Recent Results (from the past 240 hour(s))  SARS Coronavirus 2 (Hosp order,Performed [facility] lab via [employer] ID)     Status: None   Collection Time: 08/17/18  3:02 PM   Specimen: Dry Nasal Swab ([employer] ID Now)  Result Value Ref Range Status   SARS Coronavirus 2 ([employer] ID Now) NEGATIVE NEGATIVE Final    Comment: (NOTE) SARS-CoV-2 target nucleic acids are NOT DETECTED. The SARS-CoV-2 RNA is generally detectable in upper and lower respiratory specimens during the acute phase of infection.  Negativeresults do not preclude SARS-CoV-2 infection, do not rule out coinfections with other pathogens, and should not be used as the  sole basis for treatment or other patient management decisions.  Negative results must be combined with clinical observations, patient history, and epidemiological information. The expected result is Negative. Fact Sheet for  Patients: http://www.graves-[place].org/https://www.fda.gov/media/136524/download Fact Sheet for Healthcare Providers: EnviroConcern.sihttps://www.fda.gov/media/136523/download This test is not yet approved or cleared by the [place] FDA and  has been authorized for detection and/or diagnosis of SARS-CoV-2 by FDA under an Emergency Use Authorization (EUA).  This EUA will remain in effect (meaning this test can be used) for the duration of  the COVID19 declaration under Section 5 64(b)(1) of the Act, 21 U.S.C.  section [id number] 3(b)(1), unless the authorization is terminated or revoked sooner. Performed at [facility], [address] [place], [place] [address]       Radiological Exams on Admission: Ct Abdomen Pelvis Wo Contrast  Result Date: 08/17/2018 CLINICAL DATA:  epigastric pain with nausea and vomiting since Thursday. Quit drinking 2 days ago. Elevated creatinine. EXAM: CT ABDOMEN AND PELVIS WITHOUT CONTRAST TECHNIQUE: Multidetector CT imaging of the abdomen and pelvis was performed following the standard protocol without IV contrast. COMPARISON:  None. FINDINGS: Lower chest: Clear lung bases. Normal heart size without pericardial or pleural effusion. Hepatobiliary: Marked hepatic steatosis. Normal gallbladder, without biliary ductal dilatation. Pancreas: Mild pancreatic soft tissue fullness throughout with possible subtle peripancreatic edema. No duct dilatation or well-defined peripancreatic fluid collection. Spleen: Normal in size, without focal abnormality. Adrenals/Urinary Tract: Normal adrenal glands. Interpolar punctate right renal collecting system calculus. No left renal calculi or hydronephrosis. No bladder calculi. Stomach/Bowel: Normal stomach, without wall thickening. Normal colon, appendix, and terminal ileum. Normal small bowel. No free intraperitoneal air. Vascular/Lymphatic: Aortic atherosclerosis. No abdominopelvic adenopathy. Reproductive: Normal uterus and adnexa. Other: No significant free fluid.  Musculoskeletal: No acute osseous abnormality. Mild disc bulges at L4-5 and L5-S1. IMPRESSION: 1. Suggestion of pancreatic soft tissue fullness and subtle peripancreatic edema. Correlate with laboratory values to exclude mild pancreatitis. 2. No other explanation for patient's symptoms. 3. Marked hepatic steatosis. 4.  Aortic Atherosclerosis (ICD10-I70.0).  This is age advanced. 5. Right nephrolithiasis. Electronically Signed   By: Jeronimo GreavesKyle  Talbot M.D.   On: 08/17/2018 16:57   Dg Chest Portable 1 View  Result Date: 08/17/2018 CLINICAL DATA:  Shortness of breath and vomiting since yesterday. EXAM: PORTABLE CHEST 1 VIEW COMPARISON:  12/08/2017, report only. FINDINGS: Numerous leads and wires project over the chest. Midline trachea. Normal heart size and mediastinal contours. No pleural effusion or pneumothorax. Clear lungs. IMPRESSION: Normal chest. Electronically Signed   By: Ronaldo MiyamotoKyle  Jobe Igo M.D.   On: 08/17/2018 13:24    EKG: Independently reviewed.   Active Problems:   DKA (diabetic ketoacidoses) (HCC)   Assessment/Plan Acidosis: -Patient could likely have 3 acidotic processes going on at the same time (diabetic ketoacidosis, acidosis from chronic diarrhea as well as possible alcoholic ketoacidosis).   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 -Will add bicarb to the IV fluids that is used for DKA treatment. -Continue to monitor bicarb level as well as potassium level -Adjust IV fluid as deemed necessary.  Diabetic ketoacidosis: -Admit patient to ICU. -Start patient on DKA protocol phase 1 and 2. -Monitor electrolytes every 4 hours, including magnesium and phosphorus -Correct abnormal electrolytes -Consult diabetic educator -Further management will depend on hospital course.  Alcohol dependence syndrome: Continue CIWA protocol Counseled patient to quit alcohol use.  Likely alcohol related acute pancreatitis: Supportive care. Adequate pain control. Monitor closely  for complications. Low threshold to introduce antibiotics.  Chronic diarrhea: Etiology unclear Continue to assess Supportive care.   Stool analysis.    Nausea and vomiting: Likely related to acute pancreatitis and DKA Managed supportively.  Acute kidney injury versus acute kidney injury on chronic kidney disease stage II: Current AKI is likely secondary to volume depletion Hydrate patient as above Native renal function and electrolytes Further work-up if AKI does not resolve.   Leukocytosis: Likely reactive. Continue to monitor closely.  Bacteriuria/mild pyuria: Follow urine cultures Further management will depend on above.  DVT prophylaxis: Subacute Lovenox Code Status: Full code Family Communication:  Disposition Plan: Home eventually Consults called: Diabetic educator Admission status: Inpatient  Time spent: 70 minutes  Dana Allan, MD  [facility] Pager #: [phone] 7PM-7AM contact night coverage as above  08/17/2018, 9:00 PM

## 2018-08-17 NOTE — Treatment Plan (Addendum)
Called for admission   43 yo with GERD, HTN who presented to ED with abdominal pain, nausea, vomiting, and diarrhea.  Also c/o shortness of breath.  She stopped drinking on 08/11/2018.  Labs c/w DKA with hyperglycemia and acidemia with urine with ketones.  Pt also with acute kidney injury, mild hyperkalemia, mild hypercalcemia, as well as elevated lipase and liver enzymes.  CBC with leukocytosis.  Urine appears contaminated with squams, but also few bacteria, wbc's and RBC's.  ABBOT COVID 19 testing negative.  CXR negative.   She's had 4 L NS.  Now on insulin gtt.  CIWA.  Most recent vitals HR 127, hypertensive, tachypneic.  She's afebrile and on room air.  Requesting admission for DKA.  I've requested a CT abdomen pelvis with concern for pancreatitis.  Requested call back once this is done so we can review and then admit to stepdown as appropriate.  Pt will need repeat coronavirus testing once admitted at [facility]   Addendum:  Imaging with possible mild pancreatitis.  Lactate pending.  Pt receiving IVF.  Will admit to stepdown.  Needs repeat COVID 19 testing here at [facility].

## 2018-08-17 NOTE — ED Provider Notes (Addendum)
[facility] EMERGENCY DEPARTMENT Provider Note   CSN: [id number] Arrival date & time: 08/17/18  1246    History   Chief Complaint Chief Complaint  Patient presents with  . Shortness of Breath  . Emesis    HPI Kathleen Cabrera is a 43 y.o. female.     43 y.o female with a PMH of GERD,HTN presents to the ED with a chief complaint of shortness of breath 3 days. Patient reports she quit drinking alcohol on 08/11/2018 since then she has not been feeling "well". Patient was drinking daily, reports drinking 3-5 drinks in a week and double the amount on the weekends. She reports feeling short of breath at rest and with ambulation, also reports multiple episodes of nausea, diarrhea and vomiting for the past couple of days. She reports taking her medications without improvement. She also endorses pain along the epigastric region, a burning sensation which radiates from her throat to her epigastric region. She has not taking any medication for relieve. Patient was [facility] earlier but reports she left after "they weren't doing anything". She denies any chest pain, leg swelling.  The history is provided by the patient.  Shortness of Breath Associated symptoms: diaphoresis and vomiting   Associated symptoms: no abdominal pain, no chest pain, no cough, no ear pain, no fever, no rash and no sore throat   Emesis Associated symptoms: diarrhea   Associated symptoms: no abdominal pain, no arthralgias, no chills, no cough, no fever and no sore throat     Past Medical History:  Diagnosis Date  . GERD (gastroesophageal reflux disease)   . Hypertension     There are no active problems to display for this patient.   Past Surgical History:  Procedure Laterality Date  . CESAREAN SECTION    . CESAREAN SECTION       OB History   No obstetric history on file.      Home Medications    Prior to Admission medications   Medication Sig Start Date End Date Taking?  Authorizing Provider  amLODipine (NORVASC) 10 MG tablet Take 1 tablet (10 mg total) by mouth daily. 12/08/17   Robinson, [place]azil[place]Jordan N, PA-C  dextromethorphan-guaiFENesin [facility](MUCINEX DM) 30-600 MG 12hr tablet Take 1 tablet by mouth 2 (two) times daily. 02/07/16   Audry PiliMohr, Tyler, PA-C  losartan (COZAAR) 25 MG tablet Take 1 tablet (25 mg total) by mouth daily. 12/08/17   Robinson, [place]Jordan N, PA-C    Family History No family history on file.  Social History Social History   Tobacco Use  . Smoking status: Current Every Day Smoker    Packs/day: 0.50    Types: Cigarettes  . Smokeless tobacco: Never Used  Substance Use Topics  . Alcohol use: Yes  . Drug use: No     Allergies   Patient has no known allergies.   Review of Systems Review of Systems  Constitutional: Positive for diaphoresis. Negative for chills and fever.  HENT: Negative for ear pain and sore throat.   Eyes: Negative for pain and visual disturbance.  Respiratory: Positive for shortness of breath. Negative for cough.   Cardiovascular: Negative for chest pain and palpitations.  Gastrointestinal: Positive for diarrhea, nausea and vomiting. Negative for abdominal pain.  Genitourinary: Negative for dysuria and hematuria.  Musculoskeletal: Negative for arthralgias and back pain.  Skin: Negative for color change and rash.  Neurological: Negative for seizures and syncope.  All other systems reviewed and are negative.    Physical  Exam Updated Vital Signs BP (!) 181/121   Pulse (!) 129   Temp 97.6 F (36.4 C) (Oral)   Resp (!) 26   Ht 5\' 6"  (1.676 m)   Wt 81.6 kg   SpO2 100%   BMI 29.05 kg/m   Physical Exam Vitals signs and nursing note reviewed.  Constitutional:      General: She is in acute distress.     Appearance: She is well-developed.     Comments: Actively vomiting.   HENT:     Head: Normocephalic and atraumatic.     Mouth/Throat:     Pharynx: No oropharyngeal exudate.  Eyes:     Pupils: Pupils are equal,  round, and reactive to light.  Neck:     Musculoskeletal: Normal range of motion.  Cardiovascular:     Rate and Rhythm: Regular rhythm.     Heart sounds: Normal heart sounds.  Pulmonary:     Effort: Pulmonary effort is normal. No respiratory distress.     Breath sounds: Normal breath sounds. No decreased breath sounds, wheezing, rhonchi or rales.  Abdominal:     General: Bowel sounds are normal. There is no distension.     Palpations: Abdomen is soft.     Tenderness: There is abdominal tenderness in the epigastric area. There is left CVA tenderness. There is no right CVA tenderness. Negative signs include Murphy's sign and McBurney's sign.    Musculoskeletal:        General: No tenderness or deformity.     Right lower leg: No edema.     Left lower leg: No edema.  Skin:    General: Skin is warm and dry.  Neurological:     Mental Status: She is alert and oriented to person, place, and time.      ED Treatments / Results  Labs (all labs ordered are listed, but only abnormal results are displayed) Labs Reviewed  LIPASE, BLOOD - Abnormal; Notable for the following components:      Result Value   Lipase 293 (*)    All other components within normal limits  COMPREHENSIVE METABOLIC PANEL - Abnormal; Notable for the following components:   Sodium 133 (*)    Potassium 5.4 (*)    CO2 7 (*)    Glucose, Bld 751 (*)    BUN 22 (*)    Creatinine, Ser 1.77 (*)    Calcium 10.7 (*)    Total Protein 9.6 (*)    Albumin 5.2 (*)    AST 60 (*)    ALT 94 (*)    Alkaline Phosphatase 152 (*)    Total Bilirubin 1.3 (*)    GFR calc non Af Amer 35 (*)    GFR calc Af Amer 40 (*)    Anion gap 25 (*)    All other components within normal limits  CBC - Abnormal; Notable for the following components:   WBC 15.4 (*)    Hemoglobin 16.1 (*)    HCT 49.7 (*)    MCV 106.4 (*)    MCH 34.5 (*)    All other components within normal limits  URINALYSIS, ROUTINE W REFLEX MICROSCOPIC - Abnormal; Notable  for the following components:   Specific Gravity, Urine >1.030 (*)    Glucose, UA >=500 (*)    Hgb urine dipstick MODERATE (*)    Bilirubin Urine SMALL (*)    Ketones, ur >80 (*)    Protein, ur 100 (*)    All other components within normal limits  HEMOGLOBIN A1C - Abnormal; Notable for the following components:   Hgb A1c MFr Bld 10.3 (*)    All other components within normal limits  URINALYSIS, MICROSCOPIC (REFLEX) - Abnormal; Notable for the following components:   Bacteria, UA FEW (*)    All other components within normal limits  CBG MONITORING, ED - Abnormal; Notable for the following components:   Glucose-Capillary 562 (*)    All other components within normal limits  SARS CORONAVIRUS 2 (HOSP ORDER, PERFORMED IN [place] [facility] VIA ABBOTT ID)  TROPONIN I (HIGH SENSITIVITY)  PREGNANCY, URINE  TROPONIN I (HIGH SENSITIVITY)  I-STAT VENOUS BLOOD GAS, ED    EKG None  Radiology Dg Chest Portable 1 View  Result Date: 08/17/2018 CLINICAL DATA:  Shortness of breath and vomiting since yesterday. EXAM: PORTABLE CHEST 1 VIEW COMPARISON:  12/08/2017, report only. FINDINGS: Numerous leads and wires project over the chest. Midline trachea. Normal heart size and mediastinal contours. No pleural effusion or pneumothorax. Clear lungs. IMPRESSION: Normal chest. Electronically Signed   By: Abigail Miyamoto M.D.   On: 08/17/2018 13:24    Procedures .Critical Care Performed by: Janeece Fitting, PA-C Authorized by: Janeece Fitting, PA-C   Critical care provider statement:    Critical care time (minutes):  60   Critical care start time:  08/17/2018 3:15 PM   Critical care end time:  08/17/2018 4:15 PM   Critical care time was exclusive of:  Separately billable procedures and treating other patients   Critical care was necessary to treat or prevent imminent or life-threatening deterioration of the following conditions:  Metabolic crisis and endocrine crisis   Critical care was time spent personally by me on  the following activities:  Blood draw for specimens, development of treatment plan with patient or surrogate, discussions with consultants, evaluation of patient's response to treatment, examination of patient, obtaining history from patient or surrogate, ordering and performing treatments and interventions, ordering and review of laboratory studies, ordering and review of radiographic studies, pulse oximetry, re-evaluation of patient's condition and review of old charts   (including critical care time)  Medications Ordered in ED Medications  alum & mag hydroxide-simeth (MAALOX/MYLANTA) 200-200-20 MG/5ML suspension 30 mL (has no administration in time range)    And  lidocaine (XYLOCAINE) 2 % viscous mouth solution 15 mL (has no administration in time range)  LORazepam (ATIVAN) injection 0-4 mg (1 mg Intravenous Given 08/17/18 1338)    Or  LORazepam (ATIVAN) tablet 0-4 mg ( Oral See Alternative 08/17/18 1338)  LORazepam (ATIVAN) injection 0-4 mg (has no administration in time range)    Or  LORazepam (ATIVAN) tablet 0-4 mg (has no administration in time range)  thiamine (VITAMIN B-1) tablet 100 mg (has no administration in time range)    Or  thiamine (B-1) injection 100 mg (has no administration in time range)  dextrose 5 %-0.45 % sodium chloride infusion (has no administration in time range)  insulin regular, human (MYXREDLIN) 100 units/ 100 mL infusion (5 Units/hr Intravenous New Bag/Given 08/17/18 1454)  ondansetron (ZOFRAN) injection 4 mg (4 mg Intravenous Given 08/17/18 1336)  sodium chloride 0.9 % bolus 1,000 mL (0 mLs Intravenous Stopped 08/17/18 1440)  sodium chloride 0.9 % bolus 3,000 mL (3,000 mLs Intravenous New Bag/Given 08/17/18 1457)     Initial Impression / Assessment and Plan / ED Course  I have reviewed the triage vital signs and the nursing notes.  Pertinent labs & imaging results that were available during my care of the patient were  reviewed by me and considered in my medical  decision making (see chart for details).      With a past medical history of hypertension, GERD presents to the ED with a chief complaint of nausea, vomiting, weakness.  [facility], left as far as I did not have any work-up done for her.  She reports she is had nausea, vomiting along with weakness, has been unable to tolerate any p.o.  She also reports she stopped drinking a whole altogether about a week ago, otherwise she was drinking 3 to 5 drinks in a week.  Reports she stopped drinking altogether last Monday, since has began to feel more nauseated, vomiting along with GERD and burning along the epigastric region.  Primary evaluation patient arrived in the ED tachycardic, hypertensive, CIWA score of 17.  Ativan was ordered for patient along with fluids. CMP revealed hyperkalemia, hyponatremia, glucose level of 751, creatinine level significant elevated at 1.7, labs remarkable for hyperglycemia, new AKI.  She does not have a previous history of diabetes.  Lipase level was also elevated at 293, CBC showed leukocytosis of 15.4, hemoglobin A1c was 10.3, GU was ordered which showed patient to be acidotic with an anion gap of 25.  He was started on fluids, insulin along with glucose stabilizer.  UA was remarkable for bacteria, greater than 80 ketones.  A rapid COVID Abbot test was performed while in the ED, this was negative.  Chest x-ray showed no consolidation, pneumothorax, pleural effusion.  First troponin was negative.  EKG showed no changes consistent with infarct or STEMI.  Sinus tachycardia present.  Patient has received 1 full liter of fluids, ordered an additional 3 L to help with her insulin.  She is requesting watermelon at this time which have advised her I cannot provide at the moment.  Due to new onset diabetes, alcohol withdrawal, electrolyte derangement suspect patient will need hospital admission.  Will place call for admission at this time.  4:00 PM spoke to Dr.  Lowell GuitarPowell hospitalist who recommended CT abdomen and pelvis, he also recommended a lactic acid.  He would like to defer admission at this time until CT imaging has been completed. CT abdomen and lactic acid ordered.   CT Abdomen and Pelvis: 1. Suggestion of pancreatic soft tissue fullness and subtle  peripancreatic edema. Correlate with laboratory values to exclude  mild pancreatitis.  2. No other explanation for patient's symptoms.  3. Marked hepatic steatosis.  4. Aortic Atherosclerosis (ICD10-I70.0). This is age advanced.  5. Right nephrolithiasis.     5:14 PM Lactic pending, CT abdomen and pelvis resulted as were called to Dr. Lowell GuitarPowell hospitalist who [facility].  Patient updated on admission status.   Portions of this note were generated with Scientist, clinical (histocompatibility and immunogenetics)Dragon dictation software. Dictation errors may occur despite best attempts at proofreading.  Final Clinical Impressions(s) / ED Diagnoses   Final diagnoses:  Alcohol withdrawal syndrome without complication (HCC)  Hyperglycemia  Diabetes mellitus, new onset (HCC)  Nausea vomiting and diarrhea    ED Discharge Orders    None       Claude MangesSoto, Derrek Puff, PA-C 08/17/18 [address]1721    Dalin Caldera, PA-C 08/17/18 [id number]    Campos, Kevin, MD 08/18/18 [phone]

## 2018-08-17 NOTE — ED Notes (Addendum)
Glucose 751 called by Burman Nieves in Lab

## 2018-08-18 ENCOUNTER — Inpatient Hospital Stay (HOSPITAL_COMMUNITY): Payer: Managed Care, Other (non HMO)

## 2018-08-18 LAB — BASIC METABOLIC PANEL
Anion gap: 12 (ref 5–15)
Anion gap: 12 (ref 5–15)
BUN: 9 mg/dL (ref 6–20)
BUN: 7 mg/dL (ref 6–20)
CO2: 14 mmol/L — ABNORMAL LOW (ref 22–32)
CO2: 17 mmol/L — ABNORMAL LOW (ref 22–32)
Calcium: 9.1 mg/dL (ref 8.9–10.3)
Calcium: 9.1 mg/dL (ref 8.9–10.3)
Chloride: 113 mmol/L — ABNORMAL HIGH (ref 98–111)
Chloride: 109 mmol/L (ref 98–111)
Creatinine, Ser: 0.74 mg/dL (ref 0.44–1.00)
Creatinine, Ser: 0.72 mg/dL (ref 0.44–1.00)
GFR calc Af Amer: >60 mL/min (ref >60)
GFR calc Af Amer: >60 mL/min (ref >60)
GFR calc non Af Amer: >60 mL/min (ref >60)
GFR calc non Af Amer: >60 mL/min (ref >60)
Glucose, Bld: 171 mg/dL — ABNORMAL HIGH (ref 70–99)
Glucose, Bld: 170 mg/dL — ABNORMAL HIGH (ref 70–99)
Potassium: 3.4 mmol/L — ABNORMAL LOW (ref 3.5–5.1)
Potassium: 3.2 mmol/L — ABNORMAL LOW (ref 3.5–5.1)
Sodium: 139 mmol/L (ref 135–145)
Sodium: 138 mmol/L (ref 135–145)

## 2018-08-18 LAB — CBC
HCT: 42.9 % (ref 36.0–46.0)
Hemoglobin: 14.3 g/dL (ref 12.0–15.0)
MCH: 34.3 pg — ABNORMAL HIGH (ref 26.0–34.0)
MCHC: 33.3 g/dL (ref 30.0–36.0)
MCV: 102.9 fL — ABNORMAL HIGH (ref 80.0–100.0)
Platelets: 231 10*3/uL (ref 150–400)
RBC: 4.17 MIL/uL (ref 3.87–5.11)
RDW: 13.2 % (ref 11.5–15.5)
WBC: 14.3 10*3/uL — ABNORMAL HIGH (ref 4.0–10.5)
nRBC: 0 % (ref 0.0–0.2)

## 2018-08-18 LAB — GLUCOSE, CAPILLARY
Glucose-Capillary: 155 mg/dL — ABNORMAL HIGH (ref 70–99)
Glucose-Capillary: 156 mg/dL — ABNORMAL HIGH (ref 70–99)
Glucose-Capillary: 164 mg/dL — ABNORMAL HIGH (ref 70–99)
Glucose-Capillary: 170 mg/dL — ABNORMAL HIGH (ref 70–99)
Glucose-Capillary: 173 mg/dL — ABNORMAL HIGH (ref 70–99)
Glucose-Capillary: 176 mg/dL — ABNORMAL HIGH (ref 70–99)
Glucose-Capillary: 179 mg/dL — ABNORMAL HIGH (ref 70–99)
Glucose-Capillary: 184 mg/dL — ABNORMAL HIGH (ref 70–99)
Glucose-Capillary: 191 mg/dL — ABNORMAL HIGH (ref 70–99)
Glucose-Capillary: 194 mg/dL — ABNORMAL HIGH (ref 70–99)
Glucose-Capillary: 208 mg/dL — ABNORMAL HIGH (ref 70–99)
Glucose-Capillary: 312 mg/dL — ABNORMAL HIGH (ref 70–99)
Glucose-Capillary: 331 mg/dL — ABNORMAL HIGH (ref 70–99)

## 2018-08-18 LAB — LIPASE, BLOOD
Lipase: 1768 U/L — ABNORMAL HIGH (ref 11–51)
Lipase: 946 U/L — ABNORMAL HIGH (ref 11–51)

## 2018-08-18 LAB — HIV ANTIBODY (ROUTINE TESTING W REFLEX): HIV Screen 4th Generation wRfx: NONREACTIVE

## 2018-08-18 MED ORDER — LACTATED RINGERS IV SOLN
INTRAVENOUS | Status: DC
  Administered 2018-08-18 – 2018-08-20 (×4): via INTRAVENOUS

## 2018-08-18 MED ORDER — INSULIN GLARGINE 100 UNIT/ML ~~LOC~~ SOLN
20.0000 [IU] | Freq: Every day | SUBCUTANEOUS | Status: DC
  Administered 2018-08-18 – 2018-08-20 (×3): 20 [IU] via SUBCUTANEOUS
  Filled 2018-08-18 (×3): qty 0.2

## 2018-08-18 MED ORDER — LIVING WELL WITH DIABETES BOOK
Freq: Once | Status: AC
  Administered 2018-08-18: 21:00:00
  Filled 2018-08-18: qty 1

## 2018-08-18 MED ORDER — INSULIN ASPART 100 UNIT/ML ~~LOC~~ SOLN
0.0000 [IU] | SUBCUTANEOUS | Status: DC
  Administered 2018-08-18 (×2): 7 [IU] via SUBCUTANEOUS
  Administered 2018-08-19: 08:00:00 5 [IU] via SUBCUTANEOUS
  Administered 2018-08-19: 7 [IU] via SUBCUTANEOUS
  Administered 2018-08-19 (×2): 5 [IU] via SUBCUTANEOUS

## 2018-08-18 MED ORDER — POTASSIUM CHLORIDE 10 MEQ/100ML IV SOLN
10.0000 meq | INTRAVENOUS | Status: AC
  Administered 2018-08-18 (×6): 10 meq via INTRAVENOUS
  Filled 2018-08-18 (×6): qty 100

## 2018-08-18 NOTE — Progress Notes (Signed)
   08/18/18 1100  Clinical Encounter Type  Visited With Patient  Visit Type Initial;Psychological support;Spiritual support  Referral From Nurse  Consult/Referral To Chaplain  Spiritual Encounters  Spiritual Needs Brochure;Other (Comment) Market researcher Education )  Stress Factors  Patient Stress Factors Other (Comment)  Advance Directives (For Healthcare)  Does Patient Have a Medical Advance Directive? No  Would patient like information on creating a medical advance directive? Yes (Inpatient - patient requests chaplain consult to create a medical advance directive) (Patient Provided Education and Paperwork )   I spoke with Kathleen Cabrera per spiritual care consult for an Advance Directive.  I provided education and paperwork was given.  She will ask the nurse to page spiritual care when she is ready to complete.   Please, contact Spiritual Care for further assistance.   Chaplain Shanon Ace M.Div., [facility]

## 2018-08-18 NOTE — Progress Notes (Signed)
Inpatient Diabetes Program Recommendations  AACE/ADA: New Consensus Statement on Inpatient Glycemic Control (2015)  Target Ranges:  Prepandial:   less than 140 mg/dL      Peak postprandial:   less than 180 mg/dL (1-2 hours)      Critically ill patients:  140 - 180 mg/dL   Lab Results  Component Value Date   GLUCAP 331 (H) 08/18/2018   HGBA1C 10.3 (H) 08/17/2018    Review of Glycemic Control  Diabetes history: New-onset DM2 Outpatient Diabetes medications: None Current orders for Inpatient glycemic control: Lantus 20 units QD, Novolog 0-9 units Q4H  HgbA1C 10.3% FL diet  Inpatient Diabetes Program Recommendations:     When diet increases to CHO mod, add Novolog 4 units tidwc if pt eats > 50% meal. Change Novolog to 0-9 units tidwc and hs on 08/19/18.  Discussed new diagnosis with pt. Discussed A1C results 10.3%() and explained what an A1C is and informed patient that his current A1C indicates an average glucose of 249 mg/dl over the past 2-3 months. Discussed basic pathophysiology of DM Type 2, basic home care, importance of checking CBGs and maintaining good CBG control to prevent long-term and short-term complications. Reviewed glucose and A1C goals. Discussed impact of nutrition, exercise, stress, sickness, and medications on diabetes control. Ordered Living Well with Diabetes booklet and encouraged patient to read through entire book and write down any questions she might have.   Pt states she would prefer insulin pen over vial/syringe. Has appt with PCP on 7/15. Demonstrated insulin pen and will allow pt to return demonstration in am.  Will f/u in am.   Thank you. Lorenda Peck, RD, LDN, CDE Inpatient Diabetes Coordinator [phone]

## 2018-08-18 NOTE — Progress Notes (Signed)
PROGRESS NOTE    Kathleen Cabrera  ZOX:[id number] DOB: 1975-05-11 DOA: 08/17/2018 [facility]     Brief Narrative:  Kathleen Cabrera is a 43 year old African-American female with past medical history significant for alcohol abuse, hypertension, GERD and chronic diarrhea.  According to the patient, she has had diarrhea for about the month.  Patient reports having 3-4 episodes of loose, watery stools.  Patient developed nausea, vomiting and abdominal pain 2-3 days ago.  Pain is mainly around the epigastric area.  Patient reports daily use of liquor up until 2 to 3 days ago.  No headache, no neck pain, no fever or chills, no urinary symptoms.  ER note also documented that patient was short of breath.  Presentation to the hospital, temperature was 97.5, heart rate ranged from 120 to 142 bpm, respiratory rate of 29 and blood pressure of 146-185/102-113 mmHg, and O2 sat of 100%.  VBG done revealed pH of 7.096, PCO2 of 20.1 and PO2 of 34.  BMP on presentation revealed sodium of 133, potassium of 5.4, blood sugar of 751, anion gap of 25, hemoglobin A1c of 10.3%, CO2 of 7 (CO2 8 months ago was 18), BUN of 22, creatinine of 1.77 (baseline is 1.14), lactic acid of 2.2 and lipase of 293.  WBC is 15.5, hemoglobin of 16.1.  UA revealed urine glucoses greater than 500, ketones greater than 80 and urine specific gravity greater than 1.030.  Urine microscopy reveals few bacteria, 6-10 urine WBC.  Initial [facility]19 [facility] [place] [facility] was negative.  Patient has been [facility] under the hospitalist service for further assessment and management of DKA and acute pancreatitis.  New events last 24 hours / Subjective: No further nausea, vomiting or diarrhea since admission.  Complains of being hungry.  Assessment & Plan:   Active Problems:   DKA (diabetic ketoacidoses) (HCC)   Diabetic ketoacidosis -Presented with blood sugar 751 with ketones in urine, metabolic acidosis,  anion gap 25 -Newly diagnosed diabetes, hemoglobin A1c 10.3 -Improved with IV insulin overnight, anion gap closed.  Transition to Lantus, sliding scale insulin today -Diabetic coordinator consulted  Acute pancreatitis -Admitted with complaints of abdominal pain, nausea, vomiting, CT abdomen pelvis showing mild inflammation of the pancreas with elevated lipase up to 1768 -Check right upper quadrant ultrasound -Patient's last alcohol use was 7/2 -IV fluids -Clear liquid diet for now  Alcohol dependence -Continue CIWA   Acute kidney injury  -Resolved  Hypertension -Norvasc  Hypokalemia -Replace, trend    DVT prophylaxis: Lovenox Code Status: Full Family Communication: Spoke with fianc over the phone Disposition Plan: Pending further improvement and stabilization of her pancreatitis, DKA.  Diabetic coordinator consulted   Consultants:   None  Procedures:   None  Antimicrobials:  Anti-infectives (From admission, onward)   None       Objective: Vitals:   08/18/18 0400 08/18/18 0700 08/18/18 0800 08/18/18 0900  BP: 132/74 (!) 151/99 (!) 143/81 (!) 146/98  Pulse: (!) 103 (!) 102 (!) 105 (!) 108  Resp:  (!) 23 15 20   Temp: 98.7 F (37.1 C)  (!) 97 F (36.1 C)   TempSrc: Oral  Axillary   SpO2: 100% 100% 100% 100%  Weight:      Height:        Intake/Output Summary (Last 24 hours) at 08/18/2018 1100 Last data filed at 08/18/2018 1057 Gross per 24 hour  Intake 7922.1 ml  Output 1025 ml  Net 6897.1 ml  Filed Weights   08/17/18 1251  Weight: 81.6 kg    Examination:  General exam: Appears calm and comfortable  Respiratory system: Clear to auscultation. Respiratory effort normal. Cardiovascular system: S1 & S2 heard, RRR. No JVD, murmurs, rubs, gallops or clicks. No pedal edema. Gastrointestinal system: Abdomen is nondistended, soft and nontender. No organomegaly or masses felt. Normal bowel sounds heard. Central nervous system: Alert and oriented. No  focal neurological deficits. Extremities: Symmetric 5 x 5 power. Skin: No rashes, lesions or ulcers Psychiatry: Judgement and insight appear normal. Mood & affect appropriate.   Data Reviewed: I have personally reviewed following labs and imaging studies  CBC: Recent Labs  Lab 08/17/18 1311 08/17/18 1536 08/17/18 2224 08/18/18 0532  WBC 15.4*  --  16.2* 14.3*  NEUTROABS  --   --  12.3*  --   HGB 16.1* 16.7* 15.4* 14.3  HCT 49.7* 49.0* 45.6 42.9  MCV 106.4*  --  104.6* 102.9*  PLT 299  --  249 194   Basic Metabolic Panel: Recent Labs  Lab 08/17/18 1311 08/17/18 1536 08/17/18 2224 08/18/18 0237 08/18/18 0545  NA 133* 139 144 139 138  K 5.4* 5.3* 4.2 3.4* 3.2*  CL 101  --  120* 113* 109  CO2 7*  --  10* 14* 17*  GLUCOSE 751*  --  172* 171* 170*  BUN 22*  --  11 9 7   CREATININE 1.77*  --  0.98 0.74 0.72  CALCIUM 10.7*  --  9.4 9.1 9.1  MG  --   --  2.3  --   --   PHOS  --   --  1.5*  --   --    GFR: Estimated Creatinine Clearance: 97.6 mL/min (by C-G formula based on SCr of 0.72 mg/dL). Liver Function Tests: Recent Labs  Lab 08/17/18 1311  AST 60*  ALT 94*  ALKPHOS 152*  BILITOT 1.3*  PROT 9.6*  ALBUMIN 5.2*   Recent Labs  Lab 08/17/18 1311 08/17/18 2224 08/18/18 0545  LIPASE 293* 1,768* 946*   No results for input(s): AMMONIA in the last 168 hours. Coagulation Profile: No results for input(s): INR, PROTIME in the last 168 hours. Cardiac Enzymes: No results for input(s): CKTOTAL, CKMB, CKMBINDEX, TROPONINI in the last 168 hours. BNP (last 3 results) No results for input(s): PROBNP in the last 8760 hours. HbA1C: Recent Labs    08/17/18 1311  HGBA1C 10.3*   CBG: Recent Labs  Lab 08/18/18 0621 08/18/18 0736 08/18/18 0843 08/18/18 0950 08/18/18 1054  GLUCAP 194* 184* 170* 164* 173*   Lipid Profile: No results for input(s): CHOL, HDL, LDLCALC, TRIG, CHOLHDL, LDLDIRECT in the last 72 hours. Thyroid Function Tests: No results for input(s):  TSH, T4TOTAL, FREET4, T3FREE, THYROIDAB in the last 72 hours. Anemia Panel: No results for input(s): VITAMINB12, FOLATE, FERRITIN, TIBC, IRON, RETICCTPCT in the last 72 hours. Sepsis Labs: Recent Labs  Lab 08/17/18 1709 08/17/18 1832  LATICACIDVEN 2.2* 1.9    Recent Results (from the past 240 hour(s))  SARS Coronavirus 2 (Hosp order,Performed in [facility] lab via Abbott ID)     Status: None   Collection Time: 08/17/18  3:02 PM   Specimen: Dry Nasal Swab (Abbott ID Now)  Result Value Ref Range Status   SARS Coronavirus 2 (Abbott ID Now) NEGATIVE NEGATIVE Final    Comment: (NOTE) SARS-CoV-2 target nucleic acids are NOT DETECTED. The SARS-CoV-2 RNA is generally detectable in upper and lower respiratory specimens during the acute phase of infection.  Negativeresults do not preclude SARS-CoV-2 infection, do not rule out coinfections with other pathogens, and should not be used as the  sole basis for treatment or other patient management decisions.  Negative results must be combined with clinical observations, patient history, and epidemiological information. The expected result is Negative. Fact Sheet for Patients: http://www.graves-ford.org/https://www.fda.gov/media/136524/download Fact Sheet for Healthcare Providers: EnviroConcern.sihttps://www.fda.gov/media/136523/download This test is not yet approved or cleared by the [place] FDA and  has been authorized for detection and/or diagnosis of SARS-CoV-2 by FDA under an Emergency Use Authorization (EUA).  This EUA will remain in effect (meaning this test can be used) for the duration of  the COVID19 declaration under Section 5 64(b)(1) of the Act, 21 U.S.C.  section [phone] 3(b)(1), unless the authorization is terminated or revoked sooner. Performed at [facility] [place], [address] [place], [place] [address]   MRSA PCR Screening     Status: None   Collection Time: 08/17/18  8:10 PM   Specimen: Nasal Mucosa; Nasopharyngeal  Result Value Ref Range Status    MRSA by PCR NEGATIVE NEGATIVE Final    Comment:        The GeneXpert MRSA Assay (FDA approved for NASAL specimens only), is one component of a comprehensive MRSA colonization surveillance program. It is not intended to diagnose MRSA infection nor to guide or monitor treatment for MRSA infections. Performed at [facility], [address] [place], [place] [address]   SARS Coronavirus 2 (CEPHEID - Performed in [facility] hospital lab), Hosp Order     Status: None   Collection Time: 08/17/18  8:44 PM   Specimen: Nasopharyngeal Swab  Result Value Ref Range Status   SARS Coronavirus 2 NEGATIVE NEGATIVE Final    Comment: (NOTE) If result is NEGATIVE SARS-CoV-2 target nucleic acids are NOT DETECTED. The SARS-CoV-2 RNA is generally detectable in upper and lower  respiratory specimens during the acute phase of infection. The lowest  concentration of SARS-CoV-2 viral copies this assay can detect is 250  copies / mL. A negative result does not preclude SARS-CoV-2 infection  and should not be used as the sole basis for treatment or other  patient management decisions.  A negative result may occur with  improper specimen collection / handling, submission of specimen other  than nasopharyngeal swab, presence of viral mutation(s) within the  areas targeted by this assay, and inadequate number of viral copies  (<250 copies / mL). A negative result must be combined with clinical  observations, patient history, and epidemiological information. If result is POSITIVE SARS-CoV-2 target nucleic acids are DETECTED. The SARS-CoV-2 RNA is generally detectable in upper and lower  respiratory specimens dur ing the acute phase of infection.  Positive  results are indicative of active infection with SARS-CoV-2.  Clinical  correlation with patient history and other diagnostic information is  necessary to determine patient infection status.  Positive results do  not rule out  bacterial infection or co-infection with other viruses. If result is PRESUMPTIVE POSTIVE SARS-CoV-2 nucleic acids MAY BE PRESENT.   A presumptive positive result was obtained on the submitted specimen  and confirmed on repeat testing.  While 2019 novel coronavirus  (SARS-CoV-2) nucleic acids may be present in the submitted sample  additional confirmatory testing may be necessary for epidemiological  and / or clinical management purposes  to differentiate between  SARS-CoV-2 and other Sarbecovirus currently known to infect humans.  If clinically indicated additional testing with an alternate test  methodology [id number])  is advised. The SARS-CoV-2 RNA is generally  detectable in upper and lower respiratory sp ecimens during the acute  phase of infection. The expected result is Negative. Fact Sheet for Patients:  BoilerBrush.com.cyhttps://www.fda.gov/media/136312/download Fact Sheet for Healthcare Providers: https://pope.com/https://www.fda.gov/media/136313/download This test is not yet approved or cleared by the [place] FDA and has been authorized for detection and/or diagnosis of SARS-CoV-2 by FDA under an Emergency Use Authorization (EUA).  This EUA will remain in effect (meaning this test can be used) for the duration of the COVID-19 declaration under Section 564(b)(1) of the Act, 21 U.S.C. section 360bbb-3(b)(1), unless the authorization is terminated or revoked sooner. Performed at [facility], [address] [place], [place] [address]        Radiology Studies: Ct Abdomen Pelvis Wo Contrast  Result Date: 08/17/2018 CLINICAL DATA:  epigastric pain with nausea and vomiting since Thursday. Quit drinking 2 days ago. Elevated creatinine. EXAM: CT ABDOMEN AND PELVIS WITHOUT CONTRAST TECHNIQUE: Multidetector CT imaging of the abdomen and pelvis was performed following the standard protocol without IV contrast. COMPARISON:  None. FINDINGS: Lower chest: Clear lung bases. Normal heart size without  pericardial or pleural effusion. Hepatobiliary: Marked hepatic steatosis. Normal gallbladder, without biliary ductal dilatation. Pancreas: Mild pancreatic soft tissue fullness throughout with possible subtle peripancreatic edema. No duct dilatation or well-defined peripancreatic fluid collection. Spleen: Normal in size, without focal abnormality. Adrenals/Urinary Tract: Normal adrenal glands. Interpolar punctate right renal collecting system calculus. No left renal calculi or hydronephrosis. No bladder calculi. Stomach/Bowel: Normal stomach, without wall thickening. Normal colon, appendix, and terminal ileum. Normal small bowel. No free intraperitoneal air. Vascular/Lymphatic: Aortic atherosclerosis. No abdominopelvic adenopathy. Reproductive: Normal uterus and adnexa. Other: No significant free fluid. Musculoskeletal: No acute osseous abnormality. Mild disc bulges at L4-5 and L5-S1. IMPRESSION: 1. Suggestion of pancreatic soft tissue fullness and subtle peripancreatic edema. Correlate with laboratory values to exclude mild pancreatitis. 2. No other explanation for patient's symptoms. 3. Marked hepatic steatosis. 4.  Aortic Atherosclerosis (ICD10-I70.0).  This is age advanced. 5. Right nephrolithiasis. Electronically Signed   By: Jeronimo GreavesKyle  Talbot M.D.   On: 08/17/2018 16:57   Dg Chest Portable 1 View  Result Date: 08/17/2018 CLINICAL DATA:  Shortness of breath and vomiting since yesterday. EXAM: PORTABLE CHEST 1 VIEW COMPARISON:  12/08/2017, report only. FINDINGS: Numerous leads and wires project over the chest. Midline trachea. Normal heart size and mediastinal contours. No pleural effusion or pneumothorax. Clear lungs. IMPRESSION: Normal chest. Electronically Signed   By: Jeronimo GreavesKyle  Talbot M.D.   On: 08/17/2018 13:24      Scheduled Meds: . amLODipine  10 mg Oral Daily  . Chlorhexidine Gluconate Cloth  6 each Topical Daily  . enoxaparin (LOVENOX) injection  40 mg Subcutaneous QHS  . insulin aspart  0-9 Units  Subcutaneous Q4H  . insulin glargine  20 Units Subcutaneous Daily  . LORazepam  0-4 mg Intravenous Q6H   Or  . LORazepam  0-4 mg Oral Q6H  . [START ON 08/19/2018] LORazepam  0-4 mg Intravenous Q12H   Or  . [START ON 08/19/2018] LORazepam  0-4 mg Oral Q12H  . thiamine  100 mg Oral Daily   Or  . thiamine  100 mg Intravenous Daily   Continuous Infusions: . sodium chloride    . lactated ringers    . potassium chloride 20 mL/hr at 08/18/18 1057     LOS: 1 day    Time spent: 35 minutes   Noralee StainJennifer Jyl [place], DO [facility] [email or web address] 08/18/2018, 11:00  AM

## 2018-08-18 NOTE — Plan of Care (Signed)
  Problem: Education: Goal: Knowledge of General Education information will improve Description Including pain rating scale, medication(s)/side effects and non-pharmacologic comfort measures Outcome: Progressing   

## 2018-08-18 NOTE — TOC Initial Note (Signed)
Transition of Care [facility]) - Initial/Assessment Note    Patient Details  Name: Kathleen Cabrera MRN: [id number] Date of Birth: April 09, 1975  [facility] CM/SW Contact:    Nila Nephew, LCSW Phone Number: [phone] 08/18/2018, 2:03 PM  Clinical Narrative:     Pt admitted with DKA from home where she resides with her partner and 44 yr old daughter. 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 Reports she "has good insurance but things get tight sometimes with supporting my daughter, sometimes have trouble affording my prilosec and cozaar." Pt educated about goodrx.com and CSW offered to assist pt in looking up coupons however pt states she will research. CSW noted diabetes educator consult also to take place, [facility] team will follow for additional medication needs.      Expected Discharge Plan: Home/Self Care Barriers to Discharge: Continued Medical Work up   Patient Goals and CMS Choice Patient states their goals for this hospitalization and ongoing recovery are:: hope to go back home and get my medicine figured out and get back to my daughter at home      Expected Discharge Plan and Services Expected Discharge Plan: Home/Self Care In-house Referral: Clinical Social Work Discharge Planning Services: CM Consult Post Acute Care Choice: NA                                        Prior Living Arrangements/Services   Lives with:: Minor Children, Domestic Partner Patient language and need for interpreter reviewed:: No Do you feel safe going back to the place where you live?: Yes      Need for Family Participation in Patient Care: No (Comment) Care giver support system in place?: (lives with SO and daughter)   Criminal Activity/Legal Involvement Pertinent to Current Situation/Hospitalization: No - Comment as needed  Activities of Daily Living Home Assistive Devices/Equipment: None ADL Screening (condition at time of admission) Patient's cognitive ability adequate to  safely complete daily activities?: Yes Is the patient deaf or have difficulty hearing?: No Does the patient have difficulty seeing, even when wearing glasses/contacts?: No Does the patient have difficulty concentrating, remembering, or making decisions?: No Patient able to express need for assistance with ADLs?: Yes Does the patient have difficulty dressing or bathing?: No Independently performs ADLs?: Yes (appropriate for developmental age) Does the patient have difficulty walking or climbing stairs?: No Weakness of Legs: None Weakness of Arms/Hands: None  Permission Sought/Granted                  Emotional Assessment Appearance:: Appears stated age Attitude/Demeanor/Rapport: Engaged Affect (typically observed): Calm, Adaptable, Pleasant Orientation: : Oriented to Place, Oriented to Self, Oriented to  Time, Oriented to Situation Alcohol / Substance Use: Alcohol Use- reports drinking almost daily 2-3 drinks, denies ever experiencing withdrawal, last drank a few days before admission Psych Involvement: No (comment)  Admission diagnosis:  Hyperglycemia [R73.9] AKI (acute kidney injury) ([place]) [N17.9] Diabetes mellitus, new onset ([place]) [E11.9] Nausea vomiting and diarrhea [R11.2, R19.7] Alcohol withdrawal syndrome without complication ([place]) [V56.433] Patient Active Problem List   Diagnosis Date Noted  . DKA (diabetic ketoacidoses) ([place]) 08/17/2018   PCP:  Center, [place]:   [facility] [address] - [place], [place] - [address], [address] [place] [address] Phone: [phone] Fax: [phone]     Social Determinants of Health (SDOH)  Interventions    Readmission Risk Interventions No flowsheet data found.

## 2018-08-19 LAB — HEPATIC FUNCTION PANEL
ALT: 44 U/L (ref 0–44)
AST: 26 U/L (ref 15–41)
Albumin: 3.6 g/dL (ref 3.5–5.0)
Alkaline Phosphatase: 90 U/L (ref 38–126)
Bilirubin, Direct: 0.2 mg/dL (ref 0.0–0.2)
Indirect Bilirubin: 1 mg/dL — ABNORMAL HIGH (ref 0.3–0.9)
Total Bilirubin: 1.2 mg/dL (ref 0.3–1.2)
Total Protein: 6.9 g/dL (ref 6.5–8.1)

## 2018-08-19 LAB — GLUCOSE, CAPILLARY
Glucose-Capillary: 261 mg/dL — ABNORMAL HIGH (ref 70–99)
Glucose-Capillary: 294 mg/dL — ABNORMAL HIGH (ref 70–99)
Glucose-Capillary: 281 mg/dL — ABNORMAL HIGH (ref 70–99)
Glucose-Capillary: 314 mg/dL — ABNORMAL HIGH (ref 70–99)
Glucose-Capillary: 323 mg/dL — ABNORMAL HIGH (ref 70–99)
Glucose-Capillary: 275 mg/dL — ABNORMAL HIGH (ref 70–99)

## 2018-08-19 LAB — BASIC METABOLIC PANEL
Anion gap: 12 (ref 5–15)
BUN: 5 mg/dL — ABNORMAL LOW (ref 6–20)
CO2: 20 mmol/L — ABNORMAL LOW (ref 22–32)
Calcium: 9.1 mg/dL (ref 8.9–10.3)
Chloride: 101 mmol/L (ref 98–111)
Creatinine, Ser: 0.79 mg/dL (ref 0.44–1.00)
GFR calc Af Amer: >60 mL/min (ref >60)
GFR calc non Af Amer: >60 mL/min (ref >60)
Glucose, Bld: 284 mg/dL — ABNORMAL HIGH (ref 70–99)
Potassium: 3.1 mmol/L — ABNORMAL LOW (ref 3.5–5.1)
Sodium: 133 mmol/L — ABNORMAL LOW (ref 135–145)

## 2018-08-19 LAB — CBC
HCT: 40.8 % (ref 36.0–46.0)
Hemoglobin: 14.6 g/dL (ref 12.0–15.0)
MCH: 35.2 pg — ABNORMAL HIGH (ref 26.0–34.0)
MCHC: 35.8 g/dL (ref 30.0–36.0)
MCV: 98.3 fL (ref 80.0–100.0)
Platelets: 204 10*3/uL (ref 150–400)
RBC: 4.15 MIL/uL (ref 3.87–5.11)
RDW: 12.7 % (ref 11.5–15.5)
WBC: 12.9 10*3/uL — ABNORMAL HIGH (ref 4.0–10.5)
nRBC: 0 % (ref 0.0–0.2)

## 2018-08-19 LAB — MAGNESIUM: Magnesium: 1.9 mg/dL (ref 1.7–2.4)

## 2018-08-19 LAB — URINE CULTURE: Culture: NO GROWTH

## 2018-08-19 MED ORDER — INSULIN ASPART 100 UNIT/ML ~~LOC~~ SOLN
0.0000 [IU] | Freq: Three times a day (TID) | SUBCUTANEOUS | Status: DC
  Administered 2018-08-19 – 2018-08-20 (×2): 7 [IU] via SUBCUTANEOUS
  Administered 2018-08-20: 12:00:00 3 [IU] via SUBCUTANEOUS
  Administered 2018-08-20: 17:00:00 2 [IU] via SUBCUTANEOUS
  Administered 2018-08-21: 5 [IU] via SUBCUTANEOUS

## 2018-08-19 MED ORDER — INSULIN ASPART 100 UNIT/ML ~~LOC~~ SOLN
4.0000 [IU] | Freq: Three times a day (TID) | SUBCUTANEOUS | Status: DC
  Administered 2018-08-19 – 2018-08-21 (×5): 4 [IU] via SUBCUTANEOUS

## 2018-08-19 MED ORDER — PRO-STAT SUGAR FREE PO LIQD
30.0000 mL | Freq: Three times a day (TID) | ORAL | Status: DC
  Administered 2018-08-19 – 2018-08-20 (×3): 30 mL via ORAL
  Filled 2018-08-19 (×3): qty 30

## 2018-08-19 MED ORDER — ADULT MULTIVITAMIN W/MINERALS CH
1.0000 | ORAL_TABLET | Freq: Every day | ORAL | Status: DC
  Administered 2018-08-19 – 2018-08-21 (×3): 1 via ORAL
  Filled 2018-08-19 (×3): qty 1

## 2018-08-19 MED ORDER — POTASSIUM CHLORIDE CRYS ER 20 MEQ PO TBCR
40.0000 meq | EXTENDED_RELEASE_TABLET | ORAL | Status: AC
  Administered 2018-08-19 (×2): 40 meq via ORAL
  Filled 2018-08-19 (×2): qty 2

## 2018-08-19 MED ORDER — ALUM & MAG HYDROXIDE-SIMETH 200-200-20 MG/5ML PO SUSP
30.0000 mL | ORAL | Status: DC | PRN
  Administered 2018-08-19 – 2018-08-21 (×3): 30 mL via ORAL
  Filled 2018-08-19 (×3): qty 30

## 2018-08-19 NOTE — Progress Notes (Signed)
Inpatient Diabetes Program Recommendations  AACE/ADA: New Consensus Statement on Inpatient Glycemic Control (2015)  Target Ranges:  Prepandial:   less than 140 mg/dL      Peak postprandial:   less than 180 mg/dL (1-2 hours)      Critically ill patients:  140 - 180 mg/dL   Lab Results  Component Value Date   GLUCAP 314 (H) 08/19/2018   HGBA1C 10.3 (H) 08/17/2018    Review of Glycemic Control  CBGs 294, 281, 314 mg/dL  Needs insulin adjustment.  Inpatient Diabetes Program Recommendations:     Increase Lantus to 24 units QD Increase Novolog to 0-15 units tidwc and hs Novolog 4 units tidwc starting today at 1700.  Will follow-up with pen teaching in am.   Thank you. Lorenda Peck, RD, LDN, CDE Inpatient Diabetes Coordinator [phone]

## 2018-08-19 NOTE — Progress Notes (Signed)
Initial Nutrition Assessment  RD working remotely.   DOCUMENTATION CODES:   (unable to assess for malnutrition at this time.)  INTERVENTION:  - will order 30 ml prostat TID, each supplement provides 100 kcal and 15 grams protein. - will order daily multivitamin with minerals. - diet advancement as medically feasible. - continue to encourage PO intakes.    NUTRITION DIAGNOSIS:   Inadequate oral intake related to acute illness, nausea, vomiting as evidenced by per patient/family report.  GOAL:      MONITOR:   PO intake, Supplement acceptance, Labs, Weight trends, I & O's  REASON FOR ASSESSMENT:   Malnutrition Screening Tool  ASSESSMENT:   43 year old female with past medical history significant for alcohol abuse, HTN, GERD and chronic diarrhea. She reported that she has had diarrhea for about 1 month; has 3-4 episodes of loose, watery stools/day. She developed N/V and abdominal pain 2-3 days PTA. Pain is mainly around the epigastric area. Patient reported daily use of liquor up until 2 to 3 days ago. She presented to the [facility] ED where COVID-19 test was negative. She was then transferred to [facility] and admitted for further assessment and management of DKA and acute pancreatitis.  Unable to reach patient by phone. Diet advanced from NPO to Regular on 7/5 at 1:20 PM and then Heart Healthy/Carb Modified restriction added at 9:30 PM that day. Diet then downgraded to CLD on 7/6 at 7:15 AM and advanced to FLD at 5:50 PM. Per RN flow sheet, patient consumed 75% of breakfast this AM.   Per chart review, current weight is 180 lb and PTA the most recent recorded weight was on 02/07/16 when she weighed 190 lb.   Per notes: - patient has been feeling poorly overall and has been improving very slowly - DKA with CBG on admission of 751 mg/dl - acute pancreatitis; no gallbladder etiology - last alcoholic drink was 5/4--YH of alcohol dependence - AKI--resolved - hypokalemia with Mg  WDL   Medications reviewed; sliding scale novolog, 4 units novolog TID, 20 units lantus/day, 40 mEq K-Dur x2 doses 7/7, 100 mg oral thiamine/day.  Labs reviewed; CBGs: 261, 294, 281, and 314 mg/dl today, Na: 133 mmol/l, K: 3.1 mmol/l, BUN: 5 mg/dl.  IVF; LR @ 100 ml/hr.      NUTRITION - FOCUSED PHYSICAL EXAM:  unable to complete at this time.   Diet Order:   Diet Order            Diet full liquid Room service appropriate? Yes; Fluid consistency: Thin  Diet effective now              EDUCATION NEEDS:   Not appropriate for education at this time  Skin:  Skin Assessment: Reviewed RN Assessment  Last BM:  PTA/unknown  Height:   Ht Readings from Last 1 Encounters:  08/17/18 5\' 6"  (1.676 m)    Weight:   Wt Readings from Last 1 Encounters:  08/17/18 81.6 kg    Ideal Body Weight:  59.1 kg  BMI:  Body mass index is 29.05 kg/m.  Estimated Nutritional Needs:   Kcal:  0623-7628 kcal  Protein:  100-115 grams  Fluid:  >/= 2.2 L/day      Jarome Matin, MS, RD, LDN, [facility] Inpatient Clinical Dietitian Pager # [phone] After hours/weekend pager # [phone]

## 2018-08-19 NOTE — Progress Notes (Signed)
PROGRESS NOTE    Kathleen Cabrera  BMW:[id number] DOB: May 17, 1975 DOA: 08/17/2018 PCP: [facility]     Brief Narrative:  Kathleen Cabrera is a 43 year old African-American female with past medical history significant for alcohol abuse, hypertension, GERD and chronic diarrhea.  According to the patient, she has had diarrhea for about the month.  Patient reports having 3-4 episodes of loose, watery stools.  Patient developed nausea, vomiting and abdominal pain 2-3 days ago.  Pain is mainly around the epigastric area.  Patient reports daily use of liquor up until 2 to 3 days ago.  No headache, no neck pain, no fever or chills, no urinary symptoms.  ER note also documented that patient was short of breath.  Presentation to the hospital, temperature was 97.5, heart rate ranged from 120 to 142 bpm, respiratory rate of 29 and blood pressure of 146-185/102-113 mmHg, and O2 sat of 100%.  VBG done revealed pH of 7.096, PCO2 of 20.1 and PO2 of 34.  BMP on presentation revealed sodium of 133, potassium of 5.4, blood sugar of 751, anion gap of 25, hemoglobin A1c of 10.3%, CO2 of 7 (CO2 8 months ago was 18), BUN of 22, creatinine of 1.77 (baseline is 1.14), lactic acid of 2.2 and lipase of 293.  WBC is 15.5, hemoglobin of 16.1.  UA revealed urine glucoses greater than 500, ketones greater than 80 and urine specific gravity greater than 1.030.  Urine microscopy reveals few bacteria, 6-10 urine WBC.  Initial COVID-19 test done at [facility] was negative.  Patient has been transferred to [facility] long ICU under the hospitalist service for further assessment and management of DKA and acute pancreatitis.  New events last 24 hours / Subjective: Feeling poorly overall, slow improvement  Assessment & Plan:   Active Problems:   DKA (diabetic ketoacidoses) (HCC)   Diabetic ketoacidosis -Presented with blood sugar 751 with ketones in urine, metabolic acidosis, anion gap 25 -Newly diagnosed diabetes,  hemoglobin A1c 10.3 -Improved with IV insulin, anion gap closed.  Transition to Lantus, sliding scale insulin.  Added mealtime coverage today -Diabetic coordinator consulted  Acute pancreatitis -Admitted with complaints of abdominal pain, nausea, vomiting, CT abdomen pelvis showing mild inflammation of the pancreas with elevated lipase up to 1768 -Right upper quadrant ultrasound negative for acute gallbladder etiology -Patient's last alcohol use was 7/2 -IV fluids -Full liquid diet for now  Alcohol dependence -Continue CIWA   Acute kidney injury  -Resolved  Hypertension -Norvasc  Hypokalemia -Replace, trend -Magnesium 1.9    DVT prophylaxis: Lovenox Code Status: Full Family Communication: None Disposition Plan: Pending further improvement and stabilization of her pancreatitis, oral tolerance.  Diabetic coordinator consulted   Consultants:   None  Procedures:   None  Antimicrobials:  Anti-infectives (From admission, onward)   None       Objective: Vitals:   08/18/18 1200 08/18/18 2122 08/19/18 0500 08/19/18 0817  BP:  (!) 134/99 (!) 131/98 132/85  Pulse:  (!) 110 (!) 106 97  Resp:  16 16 16   Temp: 97.9 F (36.6 C) 98.6 F (37 C) 98.7 F (37.1 C) 97.9 F (36.6 C)  TempSrc: Oral Oral Oral Oral  SpO2:  100% 100% 99%  Weight:      Height:        Intake/Output Summary (Last 24 hours) at 08/19/2018 1157 Last data filed at 08/19/2018 1135 Gross per 24 hour  Intake 3798.14 ml  Output --  Net 3798.14 ml   [employer]  08/17/18 1251  Weight: 81.6 kg    Examination: General exam: Appears calm and comfortable  Respiratory system: Clear to auscultation. Respiratory effort normal. Cardiovascular system: S1 & S2 heard, RRR. No JVD, murmurs, rubs, gallops or clicks. No pedal edema. Gastrointestinal system: Abdomen is nondistended, soft and nontender. No organomegaly or masses felt. Normal bowel sounds heard. Central nervous system: Alert and oriented.  No focal neurological deficits. Extremities: Symmetric 5 x 5 power. Skin: No rashes, lesions or ulcers Psychiatry: Judgement and insight appear normal. Mood & affect appropriate.   Data Reviewed: I have personally reviewed following labs and imaging studies  CBC: Recent Labs  Lab 08/17/18 1311 08/17/18 1536 08/17/18 2224 08/18/18 0532 08/19/18 0519  WBC 15.4*  --  16.2* 14.3* 12.9*  NEUTROABS  --   --  12.3*  --   --   HGB 16.1* 16.7* 15.4* 14.3 14.6  HCT 49.7* 49.0* 45.6 42.9 40.8  MCV 106.4*  --  104.6* 102.9* 98.3  PLT 299  --  249 231 204   Basic Metabolic Panel: Recent Labs  Lab 08/17/18 1311 08/17/18 1536 08/17/18 2224 08/18/18 0237 08/18/18 0545 08/19/18 0519  NA 133* 139 144 139 138 133*  K 5.4* 5.3* 4.2 3.4* 3.2* 3.1*  CL 101  --  120* 113* 109 101  CO2 7*  --  10* 14* 17* 20*  GLUCOSE 751*  --  172* 171* 170* 284*  BUN 22*  --  5*  CREATININE 1.77*  --  0.98 0.74 0.72 0.79  CALCIUM 10.7*  --  9.4 9.1 9.1 9.1  MG  --   --  2.3  --   --  1.9  PHOS  --   --  1.5*  --   --   --    GFR: Estimated Creatinine Clearance: 97.6 mL/min (by C-G formula based on SCr of 0.79 mg/dL). Liver Function Tests: Recent Labs  Lab 08/17/18 1311 08/19/18 0519  AST 60* 26  ALT 94* 44  ALKPHOS 152* 90  BILITOT 1.3* 1.2  PROT 9.6* 6.9  ALBUMIN 5.2* 3.6   Recent Labs  Lab 08/17/18 1311 08/17/18 2224 08/18/18 0545  LIPASE 293* 1,768* 946*   No results for input(s): AMMONIA in the last 168 hours. Coagulation Profile: No results for input(s): INR, PROTIME in the last 168 hours. Cardiac Enzymes: No results for input(s): CKTOTAL, CKMB, CKMBINDEX, TROPONINI in the last 168 hours. BNP (last 3 results) No results for input(s): PROBNP in the last 8760 hours. HbA1C: Recent Labs    08/17/18 1311  HGBA1C 10.3*   CBG: Recent Labs  Lab 08/18/18 1652 08/18/18 2116 08/19/18 0119 08/19/18 0455 08/19/18 0743  GLUCAP 331* 312* 261* 294* 281*   Lipid Profile: No  results for input(s): CHOL, HDL, LDLCALC, TRIG, CHOLHDL, LDLDIRECT in the last 72 hours. Thyroid Function Tests: No results for input(s): TSH, T4TOTAL, FREET4, T3FREE, THYROIDAB in the last 72 hours. Anemia Panel: No results for input(s): VITAMINB12, FOLATE, FERRITIN, TIBC, IRON, RETICCTPCT in the last 72 hours. Sepsis Labs: Recent Labs  Lab 08/17/18 1709 08/17/18 1832  LATICACIDVEN 2.2* 1.9    Recent Results (from the past 240 hour(s))  SARS Coronavirus 2 (Hosp order,Performed in [facility] lab via [employer] ID)     Status: None   Collection Time: 08/17/18  3:02 PM   Specimen: Dry Nasal Swab ([employer] ID Now)  Result Value Ref Range Status   SARS Coronavirus 2 ([employer] ID Now) NEGATIVE NEGATIVE Final  Comment: (NOTE) SARS-CoV-2 target nucleic acids are NOT DETECTED. The SARS-CoV-2 RNA is generally detectable in upper and lower respiratory specimens during the acute phase of infection.  Negativeresults do not preclude SARS-CoV-2 infection, do not rule out coinfections with other pathogens, and should not be used as the  sole basis for treatment or other patient management decisions.  Negative results must be combined with clinical observations, patient history, and epidemiological information. The expected result is Negative. Fact Sheet for Patients: http://www.graves-ford.org/https://www.fda.gov/media/136524/download Fact Sheet for Healthcare Providers: EnviroConcern.sihttps://www.fda.gov/media/136523/download This test is not yet approved or cleared by the [place] FDA and  has been authorized for detection and/or diagnosis of SARS-CoV-2 by FDA under an Emergency Use Authorization (EUA).  This EUA will remain in effect (meaning this test can be used) for the duration of  the COVID19 declaration under Section 5 64(b)(1) of the Act, 21 U.S.C.  section [phone] 3(b)(1), unless the authorization is terminated or revoked sooner. Performed at [facility], [address] [place], [place] [address]   MRSA  PCR Screening     Status: None   Collection Time: 08/17/18  8:10 PM   Specimen: Nasal Mucosa; Nasopharyngeal  Result Value Ref Range Status   MRSA by PCR NEGATIVE NEGATIVE Final    Comment:        The GeneXpert MRSA Assay (FDA approved for NASAL specimens only), is one component of a comprehensive MRSA colonization surveillance program. It is not intended to diagnose MRSA infection nor to guide or monitor treatment for MRSA infections. Performed at [facility], [address] [place], [place] [address]   SARS Coronavirus 2 (CEPHEID - Performed in [facility] hospital lab), Hosp Order     Status: None   Collection Time: 08/17/18  8:44 PM   Specimen: Nasopharyngeal Swab  Result Value Ref Range Status   SARS Coronavirus 2 NEGATIVE NEGATIVE Final    Comment: (NOTE) If result is NEGATIVE SARS-CoV-2 target nucleic acids are NOT DETECTED. The SARS-CoV-2 RNA is generally detectable in upper and lower  respiratory specimens during the acute phase of infection. The lowest  concentration of SARS-CoV-2 viral copies this assay can detect is 250  copies / mL. A negative result does not preclude SARS-CoV-2 infection  and should not be used as the sole basis for treatment or other  patient management decisions.  A negative result may occur with  improper specimen collection / handling, submission of specimen other  than nasopharyngeal swab, presence of viral mutation(s) within the  areas targeted by this assay, and inadequate number of viral copies  (<250 copies / mL). A negative result must be combined with clinical  observations, patient history, and epidemiological information. If result is POSITIVE SARS-CoV-2 target nucleic acids are DETECTED. The SARS-CoV-2 RNA is generally detectable in upper and lower  respiratory specimens dur ing the acute phase of infection.  Positive  results are indicative of active infection with SARS-CoV-2.  Clinical  correlation with  patient history and other diagnostic information is  necessary to determine patient infection status.  Positive results do  not rule out bacterial infection or co-infection with other viruses. If result is PRESUMPTIVE POSTIVE SARS-CoV-2 nucleic acids MAY BE PRESENT.   A presumptive positive result was obtained on the submitted specimen  and confirmed on repeat testing.  While 2019 novel coronavirus  (SARS-CoV-2) nucleic acids may be present in the submitted sample  additional confirmatory testing may be necessary for epidemiological  and / or clinical management  purposes  to differentiate between  SARS-CoV-2 and other Sarbecovirus currently known to infect humans.  If clinically indicated additional testing with an alternate test  methodology [phone]([id number]) is advised. The SARS-CoV-2 RNA is generally  detectable in upper and lower respiratory sp ecimens during the acute  phase of infection. The expected result is Negative. Fact Sheet for Patients:  BoilerBrush.com.cyhttps://www.fda.gov/media/136312/download Fact Sheet for Healthcare Providers: https://pope.com/https://www.fda.gov/media/136313/download This test is not yet approved or cleared by the [place] FDA and has been authorized for detection and/or diagnosis of SARS-CoV-2 by FDA under an Emergency Use Authorization (EUA).  This EUA will remain in effect (meaning this test can be used) for the duration of the COVID-19 declaration under Section 564(b)(1) of the Act, 21 U.S.C. section 360bbb-3(b)(1), unless the authorization is terminated or revoked sooner. Performed at [facility], [address] [place], [place] [address]   Culture, Urine     Status: None   Collection Time: 08/17/18  9:35 PM   Specimen: Urine, Random  Result Value Ref Range Status   Specimen Description   Final    URINE, RANDOM Performed at [facility], [address] [place], [place] [address]    Special Requests   Final    NONE Performed at  [facility], [address] [place], [place] [address]    Culture   Final    NO GROWTH Performed at [facility], [address] [place], [place] [address]    Report Status 08/19/2018 FINAL  Final       Radiology Studies: Ct Abdomen Pelvis Wo Contrast  Result Date: 08/17/2018 CLINICAL DATA:  epigastric pain with nausea and vomiting since Thursday. Quit drinking 2 days ago. Elevated creatinine. EXAM: CT ABDOMEN AND PELVIS WITHOUT CONTRAST TECHNIQUE: Multidetector CT imaging of the abdomen and pelvis was performed following the standard protocol without IV contrast. COMPARISON:  None. FINDINGS: Lower chest: Clear lung bases. Normal heart size without pericardial or pleural effusion. Hepatobiliary: Marked hepatic steatosis. Normal gallbladder, without biliary ductal dilatation. Pancreas: Mild pancreatic soft tissue fullness throughout with possible subtle peripancreatic edema. No duct dilatation or well-defined peripancreatic fluid collection. Spleen: Normal in size, without focal abnormality. Adrenals/Urinary Tract: Normal adrenal glands. Interpolar punctate right renal collecting system calculus. No left renal calculi or hydronephrosis. No bladder calculi. Stomach/Bowel: Normal stomach, without wall thickening. Normal colon, appendix, and terminal ileum. Normal small bowel. No free intraperitoneal air. Vascular/Lymphatic: Aortic atherosclerosis. No abdominopelvic adenopathy. Reproductive: Normal uterus and adnexa. Other: No significant free fluid. Musculoskeletal: No acute osseous abnormality. Mild disc bulges at L4-5 and L5-S1. IMPRESSION: 1. Suggestion of pancreatic soft tissue fullness and subtle peripancreatic edema. Correlate with laboratory values to exclude mild pancreatitis. 2. No other explanation for patient's symptoms. 3. Marked hepatic steatosis. 4.  Aortic Atherosclerosis (ICD10-I70.0).  This is age advanced. 5. Right nephrolithiasis. Electronically Signed   By:  Jeronimo GreavesKyle  Talbot M.D.   On: 08/17/2018 16:57   Dg Chest Portable 1 View  Result Date: 08/17/2018 CLINICAL DATA:  Shortness of breath and vomiting since yesterday. EXAM: PORTABLE CHEST 1 VIEW COMPARISON:  12/08/2017, report only. FINDINGS: Numerous leads and wires project over the chest. Midline trachea. Normal heart size and mediastinal contours. No pleural effusion or pneumothorax. Clear lungs. IMPRESSION: Normal chest. Electronically Signed   By: Jeronimo GreavesKyle  Talbot M.D.   On: 08/17/2018 13:24   [place] Abdomen Limited Ruq  Result Date: 08/18/2018 CLINICAL DATA:  History of pancreatitis EXAM: ULTRASOUND ABDOMEN LIMITED RIGHT UPPER QUADRANT  COMPARISON:  CT from the previous day. FINDINGS: Gallbladder: No gallstones or wall thickening visualized. No sonographic Murphy sign noted by sonographer. Common bile duct: Diameter: 4.5 mm. Liver: Diffusely increased in echogenicity consistent with fatty infiltration similar to that seen on prior CT. No mass is noted. Portal vein is patent on color Doppler imaging with normal direction of blood flow towards the liver. IMPRESSION: Fatty liver. Electronically Signed   By: Alcide CleverMark  Lukens M.D.   On: 08/18/2018 17:14      Scheduled Meds:  amLODipine  10 mg Oral Daily   Chlorhexidine Gluconate Cloth  6 each Topical Daily   enoxaparin (LOVENOX) injection  40 mg Subcutaneous QHS   insulin aspart  0-9 Units Subcutaneous Q4H   insulin glargine  20 Units Subcutaneous Daily   LORazepam  0-4 mg Intravenous Q6H   Or   LORazepam  0-4 mg Oral Q6H   LORazepam  0-4 mg Intravenous Q12H   Or   LORazepam  0-4 mg Oral Q12H   potassium chloride  40 mEq Oral Q4H   thiamine  100 mg Oral Daily   Or   thiamine  100 mg Intravenous Daily   Continuous Infusions:  sodium chloride     lactated ringers 100 mL/hr at 08/18/18 2133     LOS: 2 days    Time spent: 20 minutes   Noralee StainJennifer Maryana Pittmon, DO [facility] [email or web address] 08/19/2018, 11:57 AM

## 2018-08-20 LAB — CBC
HCT: 37 % (ref 36.0–46.0)
Hemoglobin: 12.4 g/dL (ref 12.0–15.0)
MCH: 34.3 pg — ABNORMAL HIGH (ref 26.0–34.0)
MCHC: 33.5 g/dL (ref 30.0–36.0)
MCV: 102.2 fL — ABNORMAL HIGH (ref 80.0–100.0)
Platelets: 176 10*3/uL (ref 150–400)
RBC: 3.62 MIL/uL — ABNORMAL LOW (ref 3.87–5.11)
RDW: 12.9 % (ref 11.5–15.5)
WBC: 11.2 10*3/uL — ABNORMAL HIGH (ref 4.0–10.5)
nRBC: 0 % (ref 0.0–0.2)

## 2018-08-20 LAB — BASIC METABOLIC PANEL
Anion gap: 12 (ref 5–15)
BUN: 8 mg/dL (ref 6–20)
CO2: 17 mmol/L — ABNORMAL LOW (ref 22–32)
Calcium: 8.9 mg/dL (ref 8.9–10.3)
Chloride: 105 mmol/L (ref 98–111)
Creatinine, Ser: 0.84 mg/dL (ref 0.44–1.00)
GFR calc Af Amer: >60 mL/min (ref >60)
GFR calc non Af Amer: >60 mL/min (ref >60)
Glucose, Bld: 328 mg/dL — ABNORMAL HIGH (ref 70–99)
Potassium: 3.8 mmol/L (ref 3.5–5.1)
Sodium: 134 mmol/L — ABNORMAL LOW (ref 135–145)

## 2018-08-20 LAB — GLUCOSE, CAPILLARY
Glucose-Capillary: 324 mg/dL — ABNORMAL HIGH (ref 70–99)
Glucose-Capillary: 247 mg/dL — ABNORMAL HIGH (ref 70–99)
Glucose-Capillary: 192 mg/dL — ABNORMAL HIGH (ref 70–99)
Glucose-Capillary: 205 mg/dL — ABNORMAL HIGH (ref 70–99)

## 2018-08-20 MED ORDER — INSULIN GLARGINE 100 UNIT/ML ~~LOC~~ SOLN
24.0000 [IU] | Freq: Every day | SUBCUTANEOUS | Status: DC
  Administered 2018-08-21: 24 [IU] via SUBCUTANEOUS
  Filled 2018-08-20: qty 0.24

## 2018-08-20 MED ORDER — HYDROCODONE-ACETAMINOPHEN 5-325 MG PO TABS: 1.0000 | ORAL_TABLET | Freq: Four times a day (QID) | ORAL | Status: DC | PRN

## 2018-08-20 NOTE — Progress Notes (Signed)
PROGRESS NOTE    Kathleen Cabrera  WUJ:[id number] DOB: Nov 15, 1975 DOA: 08/17/2018 PCP: [facility]    Brief Narrative:  Kathleen Cabrera is a 43 year old African-American female with past medical history significant for alcohol abuse, hypertension, GERD and chronic diarrhea. According to the patient, she has had diarrhea for about a month. Patient reported having 3-4 episodes of loose, watery stools. Patient developed nausea, vomiting and abdominal pain 2-3days ago. Presentation to the hospital, temperature was 97.5, heart rate ranged from 120 to 142 bpm, respiratory rate of 29 and blood pressure of 146-185/102-142mmHg, and O2 sat of 100%. VBG done revealed pH of 7.096, PCO2 of 20.1 and PO2 of 34. BMP on presentation revealed sodium of 133, potassium of 5.4, blood sugar of 751, anion gap of 25, hemoglobin A1c of 10.3%, CO2 of7(CO2 8 months ago was 18), BUN of 22, creatinine of 1.77 (baseline is 1.14), lactic acid of 2.2 and lipase of 293.WBC is 15.5, hemoglobin of 16.1. UA revealed urine glucoses greater than 500, ketones greater than 80 and urine specific gravity greater than 1.030. Urine microscopy reveals few bacteria, 6-10 urine WBC. Initial COVID-19 test done at [facility] was negative. Patient has been transferred to [facility] long ICU under the hospitalist service for further assessment and management of DKA and acute pancreatitis.   Assessment & Plan:   Active Problems:   DKA (diabetic ketoacidoses) ([place])  Diabetic ketoacidosis with newly diagnosed type 2 diabetes: Presented with blood sugar 751, A1c 10.3.  Treated with insulin infusion with improvement. Started on long-acting insulin and prandial insulin, increase dose today. Continue diabetic education, nurses to teach insulin administration.  We will continue to monitor today, will decide home regimen of insulin on discharge.  Patient is motivated and she would like to use insulin pen at home.  Seen by  diabetic coordinator.  Acute pancreatitis: Probably alcoholic pancreatitis.  Lipase was 1768.  Clinically improving.  Right upper quadrant ultrasound negative for gallbladder disease.  Continue maintenance IV fluids.  She is tolerating full liquid diet will continue.  We will recheck lipase level tomorrow morning.  Alcohol abuse and dependence: Last alcohol intake 7/2 as per patient.  No evidence of alcohol withdrawal.  She is sleepy on examination.  Discontinue benzodiazepine.  Will continue multivitamins.  Acute renal failure: Due to #1.  Improved and resolved.  Will recheck tomorrow morning.  Hypokalemia: Replaced and resolved.  Recheck levels to ensure stabilization.  Hypertension: Stable on Norvasc.  DVT prophylaxis: Lovenox subcu Code Status: Full code Family Communication: None Disposition Plan: Inpatient.  Anticipate discharge home tomorrow.   Consultants:   None  Procedures:   None  Antimicrobials:   None   Subjective: Patient seen and examined.  Complained of vague epigastric pain.  Denies any nausea vomiting.  She was able to eat full liquid diet.  Patient was given Dilaudid before my examination and was dosing in between interview.  No other overnight events.  Blood sugars still remain more than 250.  Objective: Vitals:   08/19/18 0817 08/19/18 1427 08/19/18 2150 08/20/18 0534  BP: 132/85 132/76 132/89 114/76  Pulse: 97 (!) 101 (!) 106 91  Resp: 16 20 20 20   Temp: 97.9 F (36.6 C) 97.9 F (36.6 C) 98.5 F (36.9 C) 98.5 F (36.9 C)  TempSrc: Oral Oral Oral Oral  SpO2: 99% 99% 97% 96%  Weight:      Height:        Intake/Output Summary (Last 24 hours) at 08/20/2018 1302  Last data filed at 08/20/2018 0800 Gross per 24 hour  Intake 2394.07 ml  Output -  Net 2394.07 ml   Filed Weights   08/17/18 1251  Weight: 81.6 kg    Examination:  General exam: Appears calm and comfortable  Respiratory system: Clear to auscultation. Respiratory effort normal.  Cardiovascular system: S1 & S2 heard, RRR. No JVD, murmurs, rubs, gallops or clicks. No pedal edema. Gastrointestinal system: Abdomen is nondistended, soft and mild epigastric pain.  No organomegaly or masses felt. Normal bowel sounds heard. Central nervous system: Alert and oriented. No focal neurological deficits. Extremities: Symmetric 5 x 5 power. Skin: No rashes, lesions or ulcers Psychiatry: Judgement and insight appear normal. Mood & affect appropriate.     Data Reviewed: I have personally reviewed following labs and imaging studies  CBC: Recent Labs  Lab 08/17/18 1311 08/17/18 1536 08/17/18 2224 08/18/18 0532 08/19/18 0519 08/20/18 0608  WBC 15.4*  --  16.2* 14.3* 12.9* 11.2*  NEUTROABS  --   --  12.3*  --   --   --   HGB 16.1* 16.7* 15.4* 14.3 14.6 12.4  HCT 49.7* 49.0* 45.6 42.9 40.8 37.0  MCV 106.4*  --  104.6* 102.9* 98.3 102.2*  PLT 299  --  249 231 204 176   Basic Metabolic Panel: Recent Labs  Lab 08/17/18 2224 08/18/18 0237 08/18/18 0545 08/19/18 0519 08/20/18 0608  NA 144 139 138 133* 134*  K 4.2 3.4* 3.2* 3.1* 3.8  CL 120* 113* 109 101 105  CO2 10* 14* 17* 20* 17*  GLUCOSE 172* 171* 170* 284* 328*  BUN 11 9 7  5* 8  CREATININE 0.98 0.74 0.72 0.79 0.84  CALCIUM 9.4 9.1 9.1 9.1 8.9  MG 2.3  --   --  1.9  --   PHOS 1.5*  --   --   --   --    GFR: Estimated Creatinine Clearance: 93 mL/min (by C-G formula based on SCr of 0.84 mg/dL). Liver Function Tests: Recent Labs  Lab 08/17/18 1311 08/19/18 0519  AST 60* 26  ALT 94* 44  ALKPHOS 152* 90  BILITOT 1.3* 1.2  PROT 9.6* 6.9  ALBUMIN 5.2* 3.6   Recent Labs  Lab 08/17/18 1311 08/17/18 2224 08/18/18 0545  LIPASE 293* 1,768* 946*   No results for input(s): AMMONIA in the last 168 hours. Coagulation Profile: No results for input(s): INR, PROTIME in the last 168 hours. Cardiac Enzymes: No results for input(s): CKTOTAL, CKMB, CKMBINDEX, TROPONINI in the last 168 hours. BNP (last 3 results)  No results for input(s): PROBNP in the last 8760 hours. HbA1C: Recent Labs    08/17/18 1311  HGBA1C 10.3*   CBG: Recent Labs  Lab 08/19/18 1154 08/19/18 1731 08/19/18 2145 08/20/18 0802 08/20/18 1159  GLUCAP 314* 323* 275* 324* 247*   Lipid Profile: No results for input(s): CHOL, HDL, LDLCALC, TRIG, CHOLHDL, LDLDIRECT in the last 72 hours. Thyroid Function Tests: No results for input(s): TSH, T4TOTAL, FREET4, T3FREE, THYROIDAB in the last 72 hours. Anemia Panel: No results for input(s): VITAMINB12, FOLATE, FERRITIN, TIBC, IRON, RETICCTPCT in the last 72 hours. Sepsis Labs: Recent Labs  Lab 08/17/18 1709 08/17/18 1832  LATICACIDVEN 2.2* 1.9    Recent Results (from the past 240 hour(s))  SARS Coronavirus 2 (Hosp order,Performed in [facility] lab via [employer] ID)     Status: None   Collection Time: 08/17/18  3:02 PM   Specimen: Dry Nasal Swab ([employer] ID Now)  Result Value Ref Range  Status   SARS Coronavirus 2 (A[employer]ott ID Now) NEGATIVE NEGATIVE Final    Comment: (NOTE) SARS-CoV-2 target nucleic acids are NOT DETECTED. The SARS-CoV-2 RNA is generally detectable in upper and lower respiratory specimens during the acute phase of infection.  Negativeresults do not preclude SARS-CoV-2 infection, do not rule out coinfections with other pathogens, and should not be used as the  sole basis for treatment or other patient management decisions.  Negative results must be combined with clinical observations, patient history, and epidemiological information. The expected result is Negative. Fact Sheet for Patients: http://www.graves-ford.org/https://www.fda.gov/media/136524/download Fact Sheet for Healthcare Providers: EnviroConcern.sihttps://www.fda.gov/media/136523/download This test is not yet approved or cleared by the [place] FDA and  has been authorized for detection and/or diagnosis of SARS-CoV-2 by FDA under an Emergency Use Authorization (EUA).  This EUA will remain in effect (meaning this test can be used)  for the duration of  the COVID19 declaration under Section 5 64(b)(1) of the Act, 21 U.S.C.  section 4157297271360bbb 3(b)(1), unless the authorization is terminated or revoked sooner. Performed at [facility], [address] [place], [place] [address]   MRSA PCR Screening     Status: None   Collection Time: 08/17/18  8:10 PM   Specimen: Nasal Mucosa; Nasopharyngeal  Result Value Ref Range Status   MRSA by PCR NEGATIVE NEGATIVE Final    Comment:        The GeneXpert MRSA Assay (FDA approved for NASAL specimens only), is one component of a comprehensive MRSA colonization surveillance program. It is not intended to diagnose MRSA infection nor to guide or monitor treatment for MRSA infections. Performed at [facility], [address] [place], [place] [address]   SARS Coronavirus 2 (CEPHEID - Performed in [facility] hospital lab), Hosp Order     Status: None   Collection Time: 08/17/18  8:44 PM   Specimen: Nasopharyngeal Swab  Result Value Ref Range Status   SARS Coronavirus 2 NEGATIVE NEGATIVE Final    Comment: (NOTE) If result is NEGATIVE SARS-CoV-2 target nucleic acids are NOT DETECTED. The SARS-CoV-2 RNA is generally detectable in upper and lower  respiratory specimens during the acute phase of infection. The lowest  concentration of SARS-CoV-2 viral copies this assay can detect is 250  copies / mL. A negative result does not preclude SARS-CoV-2 infection  and should not be used as the sole basis for treatment or other  patient management decisions.  A negative result may occur with  improper specimen collection / handling, submission of specimen other  than nasopharyngeal swab, presence of viral mutation(s) within the  areas targeted by this assay, and inadequate number of viral copies  (<250 copies / mL). A negative result must be combined with clinical  observations, patient history, and epidemiological information. If result is POSITIVE  SARS-CoV-2 target nucleic acids are DETECTED. The SARS-CoV-2 RNA is generally detectable in upper and lower  respiratory specimens dur ing the acute phase of infection.  Positive  results are indicative of active infection with SARS-CoV-2.  Clinical  correlation with patient history and other diagnostic information is  necessary to determine patient infection status.  Positive results do  not rule out bacterial infection or co-infection with other viruses. If result is PRESUMPTIVE POSTIVE SARS-CoV-2 nucleic acids MAY BE PRESENT.   A presumptive positive result was obtained on the submitted specimen  and confirmed on repeat testing.  While 2019 novel coronavirus  (SARS-CoV-2) nucleic acids may be present in the submitted sample  additional confirmatory testing may be necessary for epidemiological  and / or clinical management purposes  to differentiate between  SARS-CoV-2 and other Sarbecovirus currently known to infect humans.  If clinically indicated additional testing with an alternate test  methodology [phone]) is advised. The SARS-CoV-2 RNA is generally  detectable in upper and lower respiratory sp ecimens during the acute  phase of infection. The expected result is Negative. Fact Sheet for Patients:  BoilerBrush.com.cyhttps://www.fda.gov/media/136312/download Fact Sheet for Healthcare Providers: https://pope.com/https://www.fda.gov/media/136313/download This test is not yet approved or cleared by the [place] FDA and has been authorized for detection and/or diagnosis of SARS-CoV-2 by FDA under an Emergency Use Authorization (EUA).  This EUA will remain in effect (meaning this test can be used) for the duration of the COVID-19 declaration under Section 564(b)(1) of the Act, 21 U.S.C. section 360bbb-3(b)(1), unless the authorization is terminated or revoked sooner. Performed at [facility], [address] [place], [place] [address]   Culture, Urine     Status: None   Collection Time:  08/17/18  9:35 PM   Specimen: Urine, Random  Result Value Ref Range Status   Specimen Description   Final    URINE, RANDOM Performed at [facility], [address] [place], [place] [address]    Special Requests   Final    NONE Performed at [facility], [address] [place], [place] [address]    Culture   Final    NO GROWTH Performed at [facility], [address] [place], [place] [address]    Report Status 08/19/2018 FINAL  Final         Radiology Studies: Koreas Abdomen Limited Ruq  Result Date: 08/18/2018 CLINICAL DATA:  History of pancreatitis EXAM: ULTRASOUND ABDOMEN LIMITED RIGHT UPPER QUADRANT COMPARISON:  CT from the previous day. FINDINGS: Gallbladder: No gallstones or wall thickening visualized. No sonographic Murphy sign noted by sonographer. Common bile duct: Diameter: 4.5 mm. Liver: Diffusely increased in echogenicity consistent with fatty infiltration similar to that seen on prior CT. No mass is noted. Portal vein is patent on color Doppler imaging with normal direction of blood flow towards the liver. IMPRESSION: Fatty liver. Electronically Signed   By: Alcide CleverMark  Lukens M.D.   On: 08/18/2018 17:14        Scheduled Meds: . amLODipine  10 mg Oral Daily  . Chlorhexidine Gluconate Cloth  6 each Topical Daily  . enoxaparin (LOVENOX) injection  40 mg Subcutaneous QHS  . feeding supplement (PRO-STAT SUGAR FREE 64)  30 mL Oral TID BM  . insulin aspart  0-9 Units Subcutaneous TID WC  . insulin aspart  4 Units Subcutaneous TID WC  . [START ON 08/21/2018] insulin glargine  24 Units Subcutaneous Daily  . multivitamin with minerals  1 tablet Oral Daily  . thiamine  100 mg Oral Daily   Or  . thiamine  100 mg Intravenous Daily   Continuous Infusions: . sodium chloride    . lactated ringers 100 mL/hr at 08/20/18 1041     LOS: 3 days    Time spent: 25 minutes    Dorcas CarrowKuber Anaeli Cornwall, MD [facility] Pager [phone]   If 7PM-7AM, please contact night-coverage [email or web address] Password [email or web address] 08/20/2018, 1:02 PM

## 2018-08-20 NOTE — Progress Notes (Signed)
Inpatient Diabetes Program Recommendations  AACE/ADA: New Consensus Statement on Inpatient Glycemic Control (2015)  Target Ranges:  Prepandial:   less than 140 mg/dL      Peak postprandial:   less than 180 mg/dL (1-2 hours)      Critically ill patients:  140 - 180 mg/dL   Lab Results  Component Value Date   GLUCAP 192 (H) 08/20/2018   HGBA1C 10.3 (H) 08/17/2018    Review of Glycemic Control  CBGs 324, 247, 192 mg/dL. Continue to titrate insulin.  Educated patient on insulin pen use at home. Reviewed contents of insulin flexpen starter kit. Reviewed all steps if insulin pen including attachment of needle, 2-unit air shot, dialing up dose, giving injection, removing needle, disposal of sharps, storage of unused insulin, disposal of insulin etc. Patient able to provide successful return demonstration. Also reviewed troubleshooting with insulin pen. MD to give patient Rxs for insulin pens and insulin pen needles.  Pt states her insurance will cover insulin pens and supplies. Reviewed diabetes survival skills, discussed hypoglycemia s/s and treatment. Pt seems very motivated to control her blood sugars.   Will follow-up in am.   Thank you. Lorenda Peck, RD, LDN, CDE Inpatient Diabetes Coordinator [phone]

## 2018-08-21 LAB — BASIC METABOLIC PANEL
Anion gap: 10 (ref 5–15)
BUN: 7 mg/dL (ref 6–20)
CO2: 20 mmol/L — ABNORMAL LOW (ref 22–32)
Calcium: 8.4 mg/dL — ABNORMAL LOW (ref 8.9–10.3)
Chloride: 103 mmol/L (ref 98–111)
Creatinine, Ser: 0.64 mg/dL (ref 0.44–1.00)
GFR calc Af Amer: >60 mL/min (ref >60)
GFR calc non Af Amer: >60 mL/min (ref >60)
Glucose, Bld: 262 mg/dL — ABNORMAL HIGH (ref 70–99)
Potassium: 3.1 mmol/L — ABNORMAL LOW (ref 3.5–5.1)
Sodium: 133 mmol/L — ABNORMAL LOW (ref 135–145)

## 2018-08-21 LAB — CBC WITH DIFFERENTIAL/PLATELET
Abs Immature Granulocytes: 0.05 10*3/uL (ref 0.00–0.07)
Basophils Absolute: 0.1 10*3/uL (ref 0.0–0.1)
Basophils Relative: 1 %
Eosinophils Absolute: 0.4 10*3/uL (ref 0.0–0.5)
Eosinophils Relative: 4 %
HCT: 35.2 % — ABNORMAL LOW (ref 36.0–46.0)
Hemoglobin: 12.4 g/dL (ref 12.0–15.0)
Immature Granulocytes: 1 %
Lymphocytes Relative: 34 %
Lymphs Abs: 3.1 10*3/uL (ref 0.7–4.0)
MCH: 35.5 pg — ABNORMAL HIGH (ref 26.0–34.0)
MCHC: 35.2 g/dL (ref 30.0–36.0)
MCV: 100.9 fL — ABNORMAL HIGH (ref 80.0–100.0)
Monocytes Absolute: 0.6 10*3/uL (ref 0.1–1.0)
Monocytes Relative: 7 %
Neutro Abs: 5 10*3/uL (ref 1.7–7.7)
Neutrophils Relative %: 53 %
Platelets: 175 10*3/uL (ref 150–400)
RBC: 3.49 MIL/uL — ABNORMAL LOW (ref 3.87–5.11)
RDW: 12.8 % (ref 11.5–15.5)
WBC: 9.2 10*3/uL (ref 4.0–10.5)
nRBC: 0 % (ref 0.0–0.2)

## 2018-08-21 LAB — GLUCOSE, CAPILLARY
Glucose-Capillary: 255 mg/dL — ABNORMAL HIGH (ref 70–99)
Glucose-Capillary: 257 mg/dL — ABNORMAL HIGH (ref 70–99)
Glucose-Capillary: 167 mg/dL — ABNORMAL HIGH (ref 70–99)
Glucose-Capillary: 176 mg/dL — ABNORMAL HIGH (ref 70–99)

## 2018-08-21 LAB — MAGNESIUM: Magnesium: 1.8 mg/dL (ref 1.7–2.4)

## 2018-08-21 LAB — LIPASE, BLOOD: Lipase: 296 U/L — ABNORMAL HIGH (ref 11–51)

## 2018-08-21 MED ORDER — BLOOD GLUCOSE MONITORING SUPPL SUPPLIES MISC: 0 refills | Status: AC

## 2018-08-21 MED ORDER — FREESTYLE LANCETS MISC: 1.0000 | Freq: Three times a day (TID) | 5 refills | Status: AC

## 2018-08-21 MED ORDER — BLOOD GLUC METER DISP-STRIPS DEVI: 1.0000 | Freq: Three times a day (TID) | 0 refills | Status: AC

## 2018-08-21 MED ORDER — INSULIN PEN NEEDLE 30G X 8 MM MISC: 1.0000 | 0 refills | Status: AC | PRN

## 2018-08-21 MED ORDER — INSULIN GLARGINE 100 UNIT/ML SOLOSTAR PEN: 24.0000 [IU] | PEN_INJECTOR | Freq: Every day | SUBCUTANEOUS | 11 refills | Status: AC

## 2018-08-21 MED ORDER — HUMALOG JUNIOR KWIKPEN 100 UNIT/ML ~~LOC~~ SOPN: 8.0000 [IU] | PEN_INJECTOR | Freq: Three times a day (TID) | SUBCUTANEOUS | 0 refills | Status: AC

## 2018-08-21 NOTE — Discharge Summary (Signed)
Physician Discharge Summary  [name] ZOX:[id number] DOB: Jun 26, 1975 DOA: 08/17/2018  [facility]P: [facility]  Admit date: 08/17/2018 Discharge date: 08/21/2018  Admitted From: home  Disposition:  Home   Recommendations for Outpatient Follow-up:  1. Follow up with PCP in 1-2 weeks   Home Health:NA  Equipment/Devices:NA   Discharge Condition:stable   CODE STATUS:Full Code  Diet recommendation: low carb diet  Brief/Interim Summary: Kathleen Redmonis a 43 year old African-American female with past medical history significant for alcohol abuse, hypertension, GERD and chronic diarrhea. According to the patient, she has had diarrhea for about a month. Patient reported having 3-4 episodes of loose, watery stools. Patient developed nausea, vomiting and abdominal pain 2-3days ago. Presentation to the hospital, temperature was 97.5, heart rate ranged from 120 to 142 bpm, respiratory rate of 29 and blood pressure of 146-185/102-15mmHg, and O2 sat of 100%. VBG done revealed pH of 7.096, PCO2 of 20.1 and PO2 of 34. BMP on presentation revealed sodium of 133, potassium of 5.4, blood sugar of 751, anion gap of 25, hemoglobin A1c of 10.3%, CO2 of7(CO2 8 months ago was 18), BUN of 22, creatinine of 1.77 (baseline is 1.14), lactic acid of 2.2 and lipase of 293.WBC is 15.5, hemoglobin of 16.1. UA revealed urine glucoses greater than 500, ketones greater than 80 and urine specific gravity greater than 1.030. Urine microscopy reveals few bacteria, 6-10 urine WBC. Initial COVID-19 test done at [facility] ER was negative. Patient was transferred to [facility] long ICU under the hospitalist service for further assessment and management of DKA and acute pancreatitis.  Discharge Diagnoses:  Active Problems:   DKA (diabetic ketoacidoses) (HCC)  Diabetic ketoacidosis with newly diagnosed type 2 diabetes: Presented with blood sugar 751, A1c 10.3.  Treated with insulin infusion with  improvement. Started on long-acting insulin and prandial insulin.  Patient is motivated and she would like to use insulin pen at home.  Seen by diabetic coordinator. Will discharge patient home with Lantus 20 units at night, NovoLog 8 units 3 times daily.  Patient had detail education about insulin administration, hypoglycemic symptoms and patient is comfortable doing it. Not starting on oral hypoglycemic agents at the time because of acute onset and recent DKA. Diet exercise and lifestyle modifications discussed. She will keep a logbook of blood sugars at home and bring it to primary care physician's office for follow-up.  Acute pancreatitis: Probably alcoholic pancreatitis.  Lipase was 1768.  Clinically improving.  Right upper quadrant ultrasound negative for gallbladder disease.    Lipase is normalizing.  She is able to tolerate full liquid diet and is eager to go home.  She will stay on liquid diet and soft food until completely improved.  Alcohol abuse and dependence: Last alcohol intake 7/2 as per patient.  No evidence of alcohol withdrawal.    Patient is motivated to stop drinking.  Acute renal failure: Due to #1.  Improved and resolved.  Will recheck tomorrow morning.  Hypertension: Stable on Norvasc.  Discharge Instructions  Discharge Instructions    Ambulatory referral to Nutrition and Diabetic Education   Complete by: As directed    Newly diagnosed DM   Call MD for:  extreme fatigue   Complete by: As directed    Call MD for:  persistant nausea and vomiting   Complete by: As directed    Call MD for:  severe uncontrolled pain   Complete by: As directed    Diet - low sodium heart healthy   Complete by: As  directed    Discharge instructions   Complete by: As directed    Stay on liquid diet and soft food until all abdominal pain improved. To check blood sugars 2 or 3 times a day and keep records bring it to primary care physician's office for follow-up. Watch for low blood  sugar symptoms as instructed.   Increase activity slowly   Complete by: As directed      Allergies as of 08/21/2018      Reactions   Chocolate Rash      Medication List    TAKE these medications   amLODipine 10 MG tablet Commonly known as: NORVASC Take 1 tablet (10 mg total) by mouth daily.   BLOOD GLUCOSE METER DISPOSABLE Devi Inject 1 strip into the skin 3 (three) times daily.   Blood Glucose Monitoring Suppl Supplies Misc Blood sugar monitoring device to be used 2 or 3 times a day.   freestyle lancets 1 each by Other route 3 (three) times daily. 2-3 times a day   Insulin Glargine 100 UNIT/ML Solostar Pen Commonly known as: LANTUS Inject 24 Units into the skin at bedtime.   insulin lispro 100 UNIT/ML KwikPen Junior Generic drug: insulin lispro Inject 0.08 mLs (8 Units total) into the skin 3 (three) times daily.   Insulin Pen Needle 30G X 8 MM Misc Commonly known as: NOVOFINE Inject 10 each into the skin as needed.   losartan 25 MG tablet Commonly known as: COZAAR Take 1 tablet (25 mg total) by mouth daily.   medroxyPROGESTERone 150 MG/ML injection Commonly known as: DEPO-PROVERA Inject 1 mL into the muscle every 3 (three) months.   omeprazole 20 MG capsule Commonly known as: PRILOSEC Take 20 mg by mouth 2 (two) times daily as needed (reflux).   promethazine 25 MG tablet Commonly known as: PHENERGAN Take 25 mg by mouth every 8 (eight) hours as needed for nausea or vomiting.       Allergies  Allergen Reactions  . Chocolate Rash    Consultations:  Critical care   Procedures/Studies: Ct Abdomen Pelvis Wo Contrast  Result Date: 08/17/2018 CLINICAL DATA:  epigastric pain with nausea and vomiting since Thursday. Quit drinking 2 days ago. Elevated creatinine. EXAM: CT ABDOMEN AND PELVIS WITHOUT CONTRAST TECHNIQUE: Multidetector CT imaging of the abdomen and pelvis was performed following the standard protocol without IV contrast. COMPARISON:  None.  FINDINGS: Lower chest: Clear lung bases. Normal heart size without pericardial or pleural effusion. Hepatobiliary: Marked hepatic steatosis. Normal gallbladder, without biliary ductal dilatation. Pancreas: Mild pancreatic soft tissue fullness throughout with possible subtle peripancreatic edema. No duct dilatation or well-defined peripancreatic fluid collection. Spleen: Normal in size, without focal abnormality. Adrenals/Urinary Tract: Normal adrenal glands. Interpolar punctate right renal collecting system calculus. No left renal calculi or hydronephrosis. No bladder calculi. Stomach/Bowel: Normal stomach, without wall thickening. Normal colon, appendix, and terminal ileum. Normal small bowel. No free intraperitoneal air. Vascular/Lymphatic: Aortic atherosclerosis. No abdominopelvic adenopathy. Reproductive: Normal uterus and adnexa. Other: No significant free fluid. Musculoskeletal: No acute osseous abnormality. Mild disc bulges at L4-5 and L5-S1. IMPRESSION: 1. Suggestion of pancreatic soft tissue fullness and subtle peripancreatic edema. Correlate with laboratory values to exclude mild pancreatitis. 2. No other explanation for patient's symptoms. 3. Marked hepatic steatosis. 4.  Aortic Atherosclerosis (ICD10-I70.0).  This is age advanced. 5. Right nephrolithiasis. Electronically Signed   By: Jeronimo Greaves M.D.   On: 08/17/2018 16:57   Dg Chest Portable 1 View  Result Date: 08/17/2018 CLINICAL DATA:  Shortness  of breath and vomiting since yesterday. EXAM: PORTABLE CHEST 1 VIEW COMPARISON:  12/08/2017, report only. FINDINGS: Numerous leads and wires project over the chest. Midline trachea. Normal heart size and mediastinal contours. No pleural effusion or pneumothorax. Clear lungs. IMPRESSION: Normal chest. Electronically Signed   By: Jeronimo GreavesKyle  Talbot M.D.   On: 08/17/2018 13:24   [place] Abdomen Limited Ruq  Result Date: 08/18/2018 CLINICAL DATA:  History of pancreatitis EXAM: ULTRASOUND ABDOMEN LIMITED RIGHT UPPER  QUADRANT COMPARISON:  CT from the previous day. FINDINGS: Gallbladder: No gallstones or wall thickening visualized. No sonographic Murphy sign noted by sonographer. Common bile duct: Diameter: 4.5 mm. Liver: Diffusely increased in echogenicity consistent with fatty infiltration similar to that seen on prior CT. No mass is noted. Portal vein is patent on color Doppler imaging with normal direction of blood flow towards the liver. IMPRESSION: Fatty liver. Electronically Signed   By: Alcide CleverMark  Lukens M.D.   On: 08/18/2018 17:14       Subjective: Patient seen and examined.  Denies any nausea vomiting.  She has mild epigastric pain.  Able to tolerate diet.  Eager to go home.  Blood sugars about 200s.  Very motivated to be treated for diabetes.   Discharge Exam: Vitals:   08/21/18 0615 08/21/18 0918  BP: (!) 130/96 125/89  Pulse: 95 (!) 113  Resp: 16   Temp: 98.4 F (36.9 C) 98.2 F (36.8 C)  SpO2: 100% 100%   Vitals:   08/20/18 1458 08/20/18 1953 08/21/18 0615 08/21/18 0918  BP: 115/77 117/85 (!) 130/96 125/89  Pulse: 99 95 95 (!) 113  Resp: 20 16 16    Temp: 98.7 F (37.1 C) 98.4 F (36.9 C) 98.4 F (36.9 C) 98.2 F (36.8 C)  TempSrc: Oral  Oral Oral  SpO2: 99% 99% 100% 100%  Weight:      Height:        General: Pt is alert, awake, not in acute distress Cardiovascular: RRR, S1/S2 +, no rubs, no gallops Respiratory: CTA bilaterally, no wheezing, no rhonchi Abdominal: Soft, NT, ND, bowel sounds + Extremities: no edema, no cyanosis    The results of significant diagnostics from this hospitalization (including imaging, microbiology, ancillary and laboratory) are listed below for reference.     Microbiology: Recent Results (from the past 240 hour(s))  SARS Coronavirus 2 (Hosp order,Performed in [facility] lab via Abbott ID)     Status: None   Collection Time: 08/17/18  3:02 PM   Specimen: Dry Nasal Swab (Abbott ID Now)  Result Value Ref Range Status   SARS Coronavirus 2  (Abbott ID Now) NEGATIVE NEGATIVE Final    Comment: (NOTE) SARS-CoV-2 target nucleic acids are NOT DETECTED. The SARS-CoV-2 RNA is generally detectable in upper and lower respiratory specimens during the acute phase of infection.  Negativeresults do not preclude SARS-CoV-2 infection, do not rule out coinfections with other pathogens, and should not be used as the  sole basis for treatment or other patient management decisions.  Negative results must be combined with clinical observations, patient history, and epidemiological information. The expected result is Negative. Fact Sheet for Patients: http://www.graves-ford.org/https://www.fda.gov/media/136524/download Fact Sheet for Healthcare Providers: EnviroConcern.sihttps://www.fda.gov/media/136523/download This test is not yet approved or cleared by the [place] FDA and  has been authorized for detection and/or diagnosis of SARS-CoV-2 by FDA under an Emergency Use Authorization (EUA).  This EUA will remain in effect (meaning this test can be used) for the duration of  the COVID19 declaration under Section 5 64(b)(1) of the  Act, 21 U.S.C.  section [phone] 3(b)(1), unless the authorization is terminated or revoked sooner. Performed at [facility], [address] [place], [place] [address]   MRSA PCR Screening     Status: None   Collection Time: 08/17/18  8:10 PM   Specimen: Nasal Mucosa; Nasopharyngeal  Result Value Ref Range Status   MRSA by PCR NEGATIVE NEGATIVE Final    Comment:        The GeneXpert MRSA Assay (FDA approved for NASAL specimens only), is one component of a comprehensive MRSA colonization surveillance program. It is not intended to diagnose MRSA infection nor to guide or monitor treatment for MRSA infections. Performed at [facility], [place] [address], [place], [place] [address]   SARS Coronavirus 2 (CEPHEID - Performed in [place] hospital lab), Hosp Order     Status: None   Collection Time: 08/17/18  8:44 PM    Specimen: Nasopharyngeal Swab  Result Value Ref Range Status   SARS Coronavirus 2 NEGATIVE NEGATIVE Final    Comment: (NOTE) If result is NEGATIVE SARS-CoV-2 target nucleic acids are NOT DETECTED. The SARS-CoV-2 RNA is generally detectable in upper and lower  respiratory specimens during the acute phase of infection. The lowest  concentration of SARS-CoV-2 viral copies this assay can detect is 250  copies / mL. A negative result does not preclude SARS-CoV-2 infection  and should not be used as the sole basis for treatment or other  patient management decisions.  A negative result may occur with  improper specimen collection / handling, submission of specimen other  than nasopharyngeal swab, presence of viral mutation(s) within the  areas targeted by this assay, and inadequate number of viral copies  (<250 copies / mL). A negative result must be combined with clinical  observations, patient history, and epidemiological information. If result is POSITIVE SARS-CoV-2 target nucleic acids are DETECTED. The SARS-CoV-2 RNA is generally detectable in upper and lower  respiratory specimens dur ing the acute phase of infection.  Positive  results are indicative of active infection with SARS-CoV-2.  Clinical  correlation with patient history and other diagnostic information is  necessary to determine patient infection status.  Positive results do  not rule out bacterial infection or co-infection with other viruses. If result is PRESUMPTIVE POSTIVE SARS-CoV-2 nucleic acids MAY BE PRESENT.   A presumptive positive result was obtained on the submitted specimen  and confirmed on repeat testing.  While 2019 novel coronavirus  (SARS-CoV-2) nucleic acids may be present in the submitted sample  additional confirmatory testing may be necessary for epidemiological  and / or clinical management purposes  to differentiate between  SARS-CoV-2 and other Sarbecovirus currently known to infect humans.  If  clinically indicated additional testing with an alternate test  methodology [phone]) is advised. The SARS-CoV-2 RNA is generally  detectable in upper and lower respiratory sp ecimens during the acute  phase of infection. The expected result is Negative. Fact Sheet for Patients:  StrictlyIdeas.no Fact Sheet for Healthcare Providers: BankingDealers.co.za This test is not yet approved or cleared by the [place] FDA and has been authorized for detection and/or diagnosis of SARS-CoV-2 by FDA under an Emergency Use Authorization (EUA).  This EUA will remain in effect (meaning this test can be used) for the duration of the COVID-19 declaration under Section 564(b)(1) of the Act, 21 U.S.C. section 360bbb-3(b)(1), unless the authorization is terminated or revoked sooner. Performed at [facility], [address] [place],  [place] [id number]   Culture, Urine     Status: None   Collection Time: 08/17/18  9:35 PM   Specimen: Urine, Random  Result Value Ref Range Status   Specimen Description   Final    URINE, RANDOM Performed at [facility], [address] [place], [place] [address]    Special Requests   Final    NONE Performed at [facility], [address] [place], [place] [address]    Culture   Final    NO GROWTH Performed at [facility], [address] [place], [place] [address]    Report Status 08/19/2018 FINAL  Final     Labs: BNP (last 3 results) No results for input(s): BNP in the last 8760 hours. Basic Metabolic Panel: Recent Labs  Lab 08/17/18 2224 08/18/18 0237 08/18/18 0545 08/19/18 0519 08/20/18 0608 08/21/18 0558  NA 144 139 138 133* 134* 133*  K 4.2 3.4* 3.2* 3.1* 3.8 3.1*  CL 120* 113* 109 101 105 103  CO2 10* 14* 17* 20* 17* 20*  GLUCOSE 172* 171* 170* 284* 328* 262*  BUN 11 9 7  5* 8 7  CREATININE 0.98 0.74 0.72 0.79 0.84 0.64  CALCIUM  9.4 9.1 9.1 9.1 8.9 8.4*  MG 2.3  --   --  1.9  --  1.8  PHOS 1.5*  --   --   --   --   --    Liver Function Tests: Recent Labs  Lab 08/17/18 1311 08/19/18 0519  AST 60* 26  ALT 94* 44  ALKPHOS 152* 90  BILITOT 1.3* 1.2  PROT 9.6* 6.9  ALBUMIN 5.2* 3.6   Recent Labs  Lab 08/17/18 1311 08/17/18 2224 08/18/18 0545 08/21/18 0558  LIPASE 293* 1,768* 946* 296*   No results for input(s): AMMONIA in the last 168 hours. CBC: Recent Labs  Lab 08/17/18 2224 08/18/18 0532 08/19/18 0519 08/20/18 0608 08/21/18 0558  WBC 16.2* 14.3* 12.9* 11.2* 9.2  NEUTROABS 12.3*  --   --   --  5.0  HGB 15.4* 14.3 14.6 12.4 12.4  HCT 45.6 42.9 40.8 37.0 35.2*  MCV 104.6* 102.9* 98.3 102.2* 100.9*  PLT 249 231 204 176 175   Cardiac Enzymes: No results for input(s): CKTOTAL, CKMB, CKMBINDEX, TROPONINI in the last 168 hours. BNP: Invalid input(s): POCBNP CBG: Recent Labs  Lab 08/20/18 1638 08/20/18 1952 08/21/18 0616 08/21/18 0800 08/21/18 1035  GLUCAP 192* 205* 255* 257* 167*   D-Dimer No results for input(s): DDIMER in the last 72 hours. Hgb A1c No results for input(s): HGBA1C in the last 72 hours. Lipid Profile No results for input(s): CHOL, HDL, LDLCALC, TRIG, CHOLHDL, LDLDIRECT in the last 72 hours. Thyroid function studies No results for input(s): TSH, T4TOTAL, T3FREE, THYROIDAB in the last 72 hours.  Invalid input(s): FREET3 Anemia work up No results for input(s): VITAMINB12, FOLATE, FERRITIN, TIBC, IRON, RETICCTPCT in the last 72 hours. Urinalysis    Component Value Date/Time   COLORURINE YELLOW 08/17/2018 1348   APPEARANCEUR CLEAR 08/17/2018 1348   LABSPEC >1.030 (H) 08/17/2018 1348   PHURINE 5.5 08/17/2018 1348   GLUCOSEU >=500 (A) 08/17/2018 1348   HGBUR MODERATE (A) 08/17/2018 1348   BILIRUBINUR SMALL (A) 08/17/2018 1348   KETONESUR >80 (A) 08/17/2018 1348   PROTEINUR 100 (A) 08/17/2018 1348   NITRITE NEGATIVE 08/17/2018 1348   LEUKOCYTESUR NEGATIVE  08/17/2018 1348   Sepsis Labs Invalid input(s): PROCALCITONIN,  WBC,  LACTICIDVEN Microbiology Recent Results (from  the past 240 hour(s))  SARS Coronavirus 2 (Hosp order,Performed in [facility] lab via Abbott ID)     Status: None   Collection Time: 08/17/18  3:02 PM   Specimen: Dry Nasal Swab (Abbott ID Now)  Result Value Ref Range Status   SARS Coronavirus 2 (Abbott ID Now) NEGATIVE NEGATIVE Final    Comment: (NOTE) SARS-CoV-2 target nucleic acids are NOT DETECTED. The SARS-CoV-2 RNA is generally detectable in upper and lower respiratory specimens during the acute phase of infection.  Negativeresults do not preclude SARS-CoV-2 infection, do not rule out coinfections with other pathogens, and should not be used as the  sole basis for treatment or other patient management decisions.  Negative results must be combined with clinical observations, patient history, and epidemiological information. The expected result is Negative. Fact Sheet for Patients: http://www.graves-ford.org/https://www.fda.gov/media/136524/download Fact Sheet for Healthcare Providers: EnviroConcern.sihttps://www.fda.gov/media/136523/download This test is not yet approved or cleared by the [place] FDA and  has been authorized for detection and/or diagnosis of SARS-CoV-2 by FDA under an Emergency Use Authorization (EUA).  This EUA will remain in effect (meaning this test can be used) for the duration of  the COVID19 declaration under Section 5 64(b)(1) of the Act, 21 U.S.C.  section [id number] 3(b)(1), unless the authorization is terminated or revoked sooner. Performed at [facility], [address] [place], [place] [address]   MRSA PCR Screening     Status: None   Collection Time: 08/17/18  8:10 PM   Specimen: Nasal Mucosa; Nasopharyngeal  Result Value Ref Range Status   MRSA by PCR NEGATIVE NEGATIVE Final    Comment:        The GeneXpert MRSA Assay (FDA approved for NASAL specimens only), is one component of a comprehensive MRSA  colonization surveillance program. It is not intended to diagnose MRSA infection nor to guide or monitor treatment for MRSA infections. Performed at [facility], [address] [place], [place] [address]   SARS Coronavirus 2 (CEPHEID - Performed in [facility] hospital lab), Hosp Order     Status: None   Collection Time: 08/17/18  8:44 PM   Specimen: Nasopharyngeal Swab  Result Value Ref Range Status   SARS Coronavirus 2 NEGATIVE NEGATIVE Final    Comment: (NOTE) If result is NEGATIVE SARS-CoV-2 target nucleic acids are NOT DETECTED. The SARS-CoV-2 RNA is generally detectable in upper and lower  respiratory specimens during the acute phase of infection. The lowest  concentration of SARS-CoV-2 viral copies this assay can detect is 250  copies / mL. A negative result does not preclude SARS-CoV-2 infection  and should not be used as the sole basis for treatment or other  patient management decisions.  A negative result may occur with  improper specimen collection / handling, submission of specimen other  than nasopharyngeal swab, presence of viral mutation(s) within the  areas targeted by this assay, and inadequate number of viral copies  (<250 copies / mL). A negative result must be combined with clinical  observations, patient history, and epidemiological information. If result is POSITIVE SARS-CoV-2 target nucleic acids are DETECTED. The SARS-CoV-2 RNA is generally detectable in upper and lower  respiratory specimens dur ing the acute phase of infection.  Positive  results are indicative of active infection with SARS-CoV-2.  Clinical  correlation with patient history and other diagnostic information is  necessary to determine patient infection status.  Positive results do  not rule out bacterial infection or co-infection with other viruses. If  result is PRESUMPTIVE POSTIVE SARS-CoV-2 nucleic acids MAY BE PRESENT.   A presumptive positive result was obtained  on the submitted specimen  and confirmed on repeat testing.  While 2019 novel coronavirus  (SARS-CoV-2) nucleic acids may be present in the submitted sample  additional confirmatory testing may be necessary for epidemiological  and / or clinical management purposes  to differentiate between  SARS-CoV-2 and other Sarbecovirus currently known to infect humans.  If clinically indicated additional testing with an alternate test  methodology [phone]) is advised. The SARS-CoV-2 RNA is generally  detectable in upper and lower respiratory sp ecimens during the acute  phase of infection. The expected result is Negative. Fact Sheet for Patients:  BoilerBrush.com.cy Fact Sheet for Healthcare Providers: https://pope.com/ This test is not yet approved or cleared by the [place] FDA and has been authorized for detection and/or diagnosis of SARS-CoV-2 by FDA under an Emergency Use Authorization (EUA).  This EUA will remain in effect (meaning this test can be used) for the duration of the COVID-19 declaration under Section 564(b)(1) of the Act, 21 U.S.C. section 360bbb-3(b)(1), unless the authorization is terminated or revoked sooner. Performed at [facility], [address], [place], [place] [address]   Culture, Urine     Status: None   Collection Time: 08/17/18  9:35 PM   Specimen: Urine, Random  Result Value Ref Range Status   Specimen Description   Final    URINE, RANDOM Performed at [facility], [address], [place], [place] [address]    Special Requests   Final    NONE Performed at [facility], [address] [place], [place] [address]    Culture   Final    NO GROWTH Performed at [facility], [address], [place], [place] [address]    Report Status 08/19/2018 FINAL  Final     Time coordinating discharge: 35 minutes  SIGNED:   Dorcas Carrow, MD  [facility] 08/21/2018, 11:14 AM Pager [phone]  If 7PM-7AM, please contact night-coverage [email or web address] Password [email or web address]

## 2018-08-21 NOTE — Progress Notes (Signed)
Inpatient Diabetes Program Recommendations  AACE/ADA: New Consensus Statement on Inpatient Glycemic Control (2015)  Target Ranges:  Prepandial:   less than 140 mg/dL      Peak postprandial:   less than 180 mg/dL (1-2 hours)      Critically ill patients:  140 - 180 mg/dL   Lab Results  Component Value Date   GLUCAP 257 (H) 08/21/2018   HGBA1C 10.3 (H) 08/17/2018    Review of Glycemic Control  CBGs 255, 257 mg/dL this morning. Pt requiring increase in both basal and bolus insulin. Lantus increased to 24 units this am.  Inpatient Diabetes Program Recommendations:     Increase Novolog to 6-8 units tidwc for meal coverage insulin.  At discharge - will need prescriptions for Lantus Solostar pen, Novolog Flexpen, insulin pen needles, blood glucose monitoring kit.  Has appt with PCP on 08/27/18.  Pt to check blood sugars at least 4x/day and take logbook to MD appt.   Thank you. Lorenda Peck, RD, LDN, CDE Inpatient Diabetes Coordinator [phone]

## 2018-11-05 ENCOUNTER — Encounter: Payer: Managed Care, Other (non HMO) | Attending: Internal Medicine | Admitting: Registered"

## 2018-11-05 ENCOUNTER — Encounter: Payer: Self-pay | Admitting: Registered"

## 2018-11-05 ENCOUNTER — Other Ambulatory Visit: Payer: Self-pay

## 2018-11-05 DIAGNOSIS — E119 Type 2 diabetes mellitus without complications: Secondary | ICD-10-CM | POA: Insufficient documentation

## 2018-11-05 NOTE — Patient Instructions (Signed)
Instructions/Goals:   Make sure to get in three meals per day. Try to have balanced meals like the plate example (see handout). Include lean proteins, vegetables, fruits, and whole grains at meals.   Continue eating something every 3-5 hours while awake.   Recommend 2-3 carbohydrate choices per meal (30-45 g per meal)  Water Goal: Try to include 48-64 oz water daily   Make physical activity a part of your week. Try to include at least 30 minutes of physical activity 5 days each week or at least 150 minutes per week. Regular physical activity promotes overall health-including helping to reduce risk for heart disease and diabetes, promoting mental health, and helping [place] sleep better.

## 2018-11-05 NOTE — Progress Notes (Signed)
Diabetes Self-Management Education  Visit Type: First/Initial  Appt. Start Time: 1515 Appt. End Time: 1645   [name], identified by name and date of birth, is a 43 y.o. female with a diagnosis of Diabetes: Type 2.   ASSESSMENT  There were no vitals taken for this visit. There is no height or weight on file to calculate BMI.   Pt present for appointment with son. 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 Pt reports strong family hx of diabetes.   Pt reports she checks blood sugar before taking insulin. Pt reports she does not always check fasting in the morning d/t getting up for work early.   Checks prior to meals:  Typically between 83-135. Reports highest since being in the hospital was 220. Fasting is usually between 80s-130s.   Pt reports having some symptoms of low blood sugar when she took insulin but did not eat for 1-2 hours. Reports she has not had this issue since figuring out she needs to eat soon after taking insulin. Reports another time it was when she didn't eat her usual snack.   Pt reports she didn't used to crave sweets before dx with diabetes but reports she craves them now. Pt reports she has not been drinking alcohol.   Pt reports she has seen her PCP since being prescribed insulin and has another appointment next month to have hgba1c rechecked and assess insulin needs.   Pt reports last hgba1c since being dx with DM was at PCP and was 11%.   Food Allergies/Intolerances: chocolate (rash).   Diabetes Self-Management Education - 11/05/18 1530      Visit Information   Visit Type  First/Initial      Initial Visit   Diabetes Type  Type 2    Are you currently following a meal plan?  No    Are you taking your medications as prescribed?  Yes    Date Diagnosed  08/17/2018      Health Coping   How would you rate your overall health?  Poor      Psychosocial Assessment   Patient Belief/Attitude about Diabetes  Afraid    Self-care barriers  Unable to  determine    Self-management support  --   [facility]   Other persons present  Patient;Family Member    Patient Concerns  Weight Control;Nutrition/Meal planning   vision   Special Needs  Unable to determine    Preferred Learning Style  No preference indicated    Learning Readiness  Ready    How often do you need to have someone help you when you read instructions, pamphlets, or other written materials from your doctor or pharmacy?  1 - Never    What is the last grade level you completed in school?  GED      Pre-Education Assessment   Patient understands the diabetes disease and treatment process.  Needs Instruction    Patient understands incorporating nutritional management into lifestyle.  Needs Instruction    Patient undertands incorporating physical activity into lifestyle.  Needs Instruction    Patient understands using medications safely.  Demonstrates understanding / competency    Patient understands monitoring blood glucose, interpreting and using results  Needs Instruction    Patient understands prevention, detection, and treatment of acute complications.  Needs Instruction    Patient understands prevention, detection, and treatment of chronic complications.  Needs Instruction    Patient understands how to develop strategies to address psychosocial issues.  Needs Instruction  Patient understands how to develop strategies to promote health/change behavior.  Needs Instruction      Complications   Last HgB A1C per patient/outside source  10.3 %   08/2018   How often do you check your blood sugar?  3-4 times / week    Fasting Blood glucose range (mg/dL)  70-129;130-179    Postprandial Blood glucose range (mg/dL)  --   Pt doesn't check after meals.   Number of hypoglycemic episodes per month  2    Can you tell when your blood sugar is low?  Yes    What do you do if your blood sugar is low?  eats something    Number of hyperglycemic episodes per week  1    Can you tell when your  blood sugar is high?  No    Have you had a dilated eye exam in the past 12 months?  No    Have you had a dental exam in the past 12 months?  Yes    Are you checking your feet?  No      Dietary Intake   Breakfast  815 AM: egg salad sandwich on honey wheat, Danimals yogurt smoothie, coffee with Splenda and Cafe Mate hazelnut creamer    Snack (morning)  1015 AM: peanut butter crackers, SF Jolly Ranchers; 2 peanut butter logs (sugar free)    Lunch  1230 PM: [employer]-whopper, Diet Coke    Snack (afternoon)  230 PM: sugar free short bread cookies; pretzels; bite of Sweet Tarts robe    Dinner  6PM: Honey Nut Cheerios with 2% milk    Beverage(s)  2 diet sodas; 1 cup coffee; 1 bottle water      Exercise   Exercise Type  Light (walking / raking leaves);ADL's    How many days per week to you exercise?  2    How many minutes per day do you exercise?  55    Total minutes per week of exercise  110      Patient Education   Previous Diabetes Education  No    Disease state   Definition of diabetes, type 1 and 2, and the diagnosis of diabetes;Factors that contribute to the development of diabetes    Nutrition management   Role of diet in the treatment of diabetes and the relationship between the three main macronutrients and blood glucose level;Food label reading, portion sizes and measuring food.;Carbohydrate counting;Effects of alcohol on blood glucose and safety factors with consumption of alcohol.    Physical activity and exercise   Role of exercise on diabetes management, blood pressure control and cardiac health.    Medications  Reviewed patients medication for diabetes, action, purpose, timing of dose and side effects.    Monitoring  Purpose and frequency of SMBG.;Interpreting lab values - A1C, lipid, urine microalbumina.;Identified appropriate SMBG and/or A1C goals.;Yearly dilated eye exam;Daily foot exams    Acute complications  Taught treatment of hypoglycemia - the 15 rule.    Chronic  complications  Relationship between chronic complications and blood glucose control;Assessed and discussed foot care and prevention of foot problems;Lipid levels, blood glucose control and heart disease;Dental care;Retinopathy and reason for yearly dilated eye exams;Nephropathy, what it is, prevention of, the use of ACE, ARB's and early detection of through urine microalbumia.;Reviewed with patient heart disease, higher risk of, and prevention    Psychosocial adjustment  Role of stress on diabetes      Individualized Goals (developed by patient)   Nutrition  Follow meal plan discussed;General guidelines for healthy choices and portions discussed    Physical Activity  Exercise 1-2 times per week;Exercise 3-5 times per week;30 minutes per day;15 minutes per day;60 minutes per day    Medications  take my medication as prescribed    Monitoring   test my blood glucose as discussed      Post-Education Assessment   Patient understands the diabetes disease and treatment process.  Demonstrates understanding / competency    Patient understands incorporating nutritional management into lifestyle.  Demonstrates understanding / competency    Patient undertands incorporating physical activity into lifestyle.  Demonstrates understanding / competency    Patient understands using medications safely.  Demonstrates understanding / competency    Patient understands monitoring blood glucose, interpreting and using results  Demonstrates understanding / competency    Patient understands prevention, detection, and treatment of acute complications.  Demonstrates understanding / competency    Patient understands prevention, detection, and treatment of chronic complications.  Demonstrates understanding / competency    Patient understands how to develop strategies to address psychosocial issues.  Demonstrates understanding / competency    Patient understands how to develop strategies to promote health/change behavior.   Demonstrates understanding / competency      Outcomes   Expected Outcomes  Demonstrated interest in learning. Expect positive outcomes    Future DMSE  4-6 wks    Program Status  Completed       Individualized Plan for Diabetes Self-Management Training:   Learning Objective:  Patient will have a greater understanding of diabetes self-management. Patient education plan is to attend individual and/or group sessions per assessed needs and concerns.   Instructions/Goals:   Make sure to get in three meals per day. Try to have balanced meals like the plate example (see handout). Include lean proteins, vegetables, fruits, and whole grains at meals.   Continue eating something every 3-5 hours while awake.   Recommend 2-3 carbohydrate choices per meal (30-45 g per meal)  Water Goal: Try to include 48-64 oz water daily   Make physical activity a part of your week. Try to include at least 30 minutes of physical activity 5 days each week or at least 150 minutes per week. Regular physical activity promotes overall health-including helping to reduce risk for heart disease and diabetes, promoting mental health, and helping [place] sleep better.    Patient Instructions  Instructions/Goals:   Make sure to get in three meals per day. Try to have balanced meals like the plate example (see handout). Include lean proteins, vegetables, fruits, and whole grains at meals.   Continue eating something every 3-5 hours while awake.   Recommend 2-3 carbohydrate choices per meal (30-45 g per meal)  Water Goal: Try to include 48-64 oz water daily   Make physical activity a part of your week. Try to include at least 30 minutes of physical activity 5 days each week or at least 150 minutes per week. Regular physical activity promotes overall health-including helping to reduce risk for heart disease and diabetes, promoting mental health, and helping [place] sleep better.        Expected Outcomes:  Demonstrated interest  in learning. Expect positive outcomes  Education material provided: ADA - How to Thrive: A Guide for Your Journey with Diabetes, My Plate and Snack sheet  If problems or questions, patient to contact team via:  Phone and Email  Future DSME appointment: 4-6 wks

## 2018-12-11 ENCOUNTER — Ambulatory Visit: Payer: Managed Care, Other (non HMO) | Admitting: Registered"

## 2018-12-13 ENCOUNTER — Encounter (HOSPITAL_BASED_OUTPATIENT_CLINIC_OR_DEPARTMENT_OTHER): Payer: Self-pay

## 2018-12-13 ENCOUNTER — Other Ambulatory Visit: Payer: Self-pay

## 2018-12-13 ENCOUNTER — Emergency Department (HOSPITAL_BASED_OUTPATIENT_CLINIC_OR_DEPARTMENT_OTHER)
Admission: EM | Admit: 2018-12-13 | Discharge: 2018-12-13 | Disposition: A | Discharge status: Home / Self Care | Payer: Managed Care, Other (non HMO) | Attending: Emergency Medicine | Admitting: Emergency Medicine

## 2018-12-13 DIAGNOSIS — F17210 Nicotine dependence, cigarettes, uncomplicated: Secondary | ICD-10-CM | POA: Diagnosis not present

## 2018-12-13 DIAGNOSIS — Z794 Long term (current) use of insulin: Secondary | ICD-10-CM | POA: Diagnosis not present

## 2018-12-13 DIAGNOSIS — I10 Essential (primary) hypertension: Secondary | ICD-10-CM | POA: Insufficient documentation

## 2018-12-13 DIAGNOSIS — Z20828 Contact with and (suspected) exposure to other viral communicable diseases: Secondary | ICD-10-CM | POA: Diagnosis not present

## 2018-12-13 DIAGNOSIS — E119 Type 2 diabetes mellitus without complications: Secondary | ICD-10-CM | POA: Insufficient documentation

## 2018-12-13 DIAGNOSIS — R059 Cough, unspecified: Secondary | ICD-10-CM

## 2018-12-13 DIAGNOSIS — R05 Cough: Secondary | ICD-10-CM | POA: Diagnosis not present

## 2018-12-13 DIAGNOSIS — Z20822 Contact with and (suspected) exposure to covid-19: Secondary | ICD-10-CM

## 2018-12-13 DIAGNOSIS — Z79899 Other long term (current) drug therapy: Secondary | ICD-10-CM | POA: Insufficient documentation

## 2018-12-13 NOTE — ED Provider Notes (Signed)
[facility] EMERGENCY DEPARTMENT Provider Note   CSN: [id number] Arrival date & time: 12/13/18  1029     History   Chief Complaint Chief Complaint  Patient presents with  . Cough    HPI Kathleen Cabrera is a 43 y.o. female presenting for evaluation of cough.  Patient states last night she developed a cough.  Today cough is persisted.  She feels like there is something in her chest, but her cough is nonproductive.  She denies fevers, chills, nasal congestion, sore throat, chest pain, shortness of breath, nausea, vomiting abdominal pain, urinary symptoms, normal bowel movements.  She reports multiple employees where she works tested positive for [employer], and she has had contact with him.  She denies other Covid positive contacts.  She has a history of diabetes, states is well controlled and her blood sugars have been normal.  She smokes half a pack of cigarettes a day.  Denies history of asthma or COPD or other lung problems.  She has an additional history of hypertension which is treated with medication, no other medical problems.     HPI  Past Medical History:  Diagnosis Date  . Diabetes mellitus without complication (HCC)   . GERD (gastroesophageal reflux disease)   . Hypertension     Patient Active Problem List   Diagnosis Date Noted  . DKA (diabetic ketoacidoses) (HCC) 08/17/2018    Past Surgical History:  Procedure Laterality Date  . CESAREAN SECTION    . CESAREAN SECTION       OB History   No obstetric history on file.      Home Medications    Prior to Admission medications   Medication Sig Start Date End Date Taking? Authorizing Provider  amLODipine (NORVASC) 10 MG tablet Take 1 tablet (10 mg total) by mouth daily. 12/08/17   Robinson, [place] N, PA-C  Blood Gluc Meter Disp-Strips (BLOOD GLUCOSE METER DISPOSABLE) DEVI Inject 1 strip into the skin 3 (three) times daily. 08/21/18   Dorcas Carrow, MD  Blood Glucose Monitoring Suppl Supplies MISC Blood sugar  monitoring device to be used 2 or 3 times a day. 08/21/18   Dorcas Carrow, MD  insulin detemir (LEVEMIR) 100 UNIT/ML injection Inject into the skin daily.    [provider]  Insulin Glargine (LANTUS) 100 UNIT/ML Solostar Pen Inject 24 Units into the skin at bedtime. 08/21/18   Dorcas Carrow, MD  insulin lispro (INSULIN LISPRO) 100 UNIT/ML KwikPen Junior Inject 0.08 mLs (8 Units total) into the skin 3 (three) times daily. 08/21/18   Dorcas Carrow, MD  Insulin Pen Needle (NOVOFINE) 30G X 8 MM MISC Inject 10 each into the skin as needed. 08/21/18   Dorcas Carrow, MD  Lancets (FREESTYLE) lancets 1 each by Other route 3 (three) times daily. 2-3 times a day 08/21/18   Dorcas Carrow, MD  losartan (COZAAR) 25 MG tablet Take 1 tablet (25 mg total) by mouth daily. 12/08/17   Robinson, [place] N, PA-C  medroxyPROGESTERone (DEPO-PROVERA) 150 MG/ML injection Inject 1 mL into the muscle every 3 (three) months. 06/05/18   [provider]  omeprazole (PRILOSEC) 20 MG capsule Take 20 mg by mouth 2 (two) times daily as needed (reflux).  06/13/18   [provider]  promethazine (PHENERGAN) 25 MG tablet Take 25 mg by mouth every 8 (eight) hours as needed for nausea or vomiting.  08/17/18   [provider]    Family History Family History  Problem Relation Age of Onset  . Hypertension Mother   .  Diabetes Mother   . Hyperlipidemia Mother   . Cancer Father   . Hypertension Father   . Diabetes Brother   . Cancer Maternal Aunt   . Hypertension Maternal Grandmother   . Hypertension Maternal Grandfather   . Stroke Maternal Grandfather   . Hypertension Paternal Grandmother   . Diabetes Paternal Grandmother   . Hypertension Paternal Grandfather   . Stroke Paternal Grandfather   . Cancer Other     Social History Social History   Tobacco Use  . Smoking status: Current Every Day Smoker    Packs/day: 0.50    Types: Cigarettes  . Smokeless tobacco: Never Used  Substance Use Topics  .  Alcohol use: Yes  . Drug use: No     Allergies   Chocolate   Review of Systems Review of Systems  Constitutional: Negative for fever.  Respiratory: Positive for cough. Negative for shortness of breath.      Physical Exam Updated Vital Signs BP (!) 123/95 (BP Location: Right Arm)   Pulse 90   Temp 98.6 F (37 C) (Oral)   Resp 16   Ht 5\' 3"  (1.6 m)   Wt 83.5 kg   SpO2 99%   BMI 32.59 kg/m   Physical Exam Vitals signs and nursing note reviewed.  Constitutional:      General: She is not in acute distress.    Appearance: She is well-developed.     Comments: Resting comfortably in the bed in no acute distress  HENT:     Head: Normocephalic and atraumatic.  Eyes:     Conjunctiva/sclera: Conjunctivae normal.     Pupils: Pupils are equal, round, and reactive to light.  Neck:     Musculoskeletal: Normal range of motion.  Cardiovascular:     Rate and Rhythm: Normal rate and regular rhythm.     Pulses: Normal pulses.  Pulmonary:     Effort: Pulmonary effort is normal.     Breath sounds: Normal breath sounds. No decreased breath sounds, wheezing, rhonchi or rales.     Comments: Speaking in full sentences.  Clear lung sounds in all fields. Abdominal:     General: There is no distension.     Palpations: Abdomen is soft.     Tenderness: There is no abdominal tenderness.  Musculoskeletal: Normal range of motion.     Left lower leg: No edema.  Lymphadenopathy:     Cervical: No cervical adenopathy.  Skin:    General: Skin is warm.     Capillary Refill: Capillary refill takes less than 2 seconds.  Neurological:     Mental Status: She is alert and oriented to person, place, and time.      ED Treatments / Results  Labs (all labs ordered are listed, but only abnormal results are displayed) Labs Reviewed  NOVEL CORONAVIRUS, NAA (HOSP ORDER, SEND-OUT TO REF LAB; TAT 18-24 HRS)    EKG None  Radiology No results found.  Procedures Procedures (including critical  care time)  Medications Ordered in ED Medications - No data to display   Initial Impression / Assessment and Plan / ED Course  I have reviewed the triage vital signs and the nursing notes.  Pertinent labs & imaging results that were available during my care of the patient were reviewed by me and considered in my medical decision making (see chart for details).        Patient presenting with I day h/o cough. Physical exam reassuring, patient is afebrile and appears nontoxic.  Pulmonary exam reassuring.  Doubt pneumonia, strep, other bacterial infection, or peritonsillar abscess. Likely viral URI, consider covid due to close exposure.  Will treat symptomatically and perform send out covid test.  Patient to follow-up with primary care as needed.  Discussed monitoring respiratory status and return if she has worsening shortness of breath.  At this time, patient appears safe for discharge.  Return precautions given.  Patient states she understands and agrees to plan.  Darreld Mcleanrina Beedy was evaluated in Emergency Department on 12/13/2018 for the symptoms described in the history of present illness. She was evaluated in the context of the global COVID-19 pandemic, which necessitated consideration that the patient might be at risk for infection with the SARS-CoV-2 virus that causes COVID-19. Institutional protocols and algorithms that pertain to the evaluation of patients at risk for COVID-19 are in a state of rapid change based on information released by regulatory bodies including the CDC and federal and state organizations. These policies and algorithms were followed during the patient's care in the ED.   Final Clinical Impressions(s) / ED Diagnoses   Final diagnoses:  Cough  Close exposure to COVID-19 virus    ED Discharge Orders    None       Alveria ApleyCaccavale, Shinika Estelle, PA-C 12/13/18 1111    Melene PlanFloyd, Dan, DO 12/13/18 1147

## 2018-12-13 NOTE — Discharge Instructions (Addendum)
Treat symptomatically.  Use Tylenol or ibuprofen as needed for headache, body aches, fever. Use cough drops as needed. Make sure stay well-hydrated water. You are being tested for coronavirus.  Results should return in several days.  If positive, you receive a phone call.  If negative, you will not.  Either way, you may check online MyChart. Monitor for worsening respiratory status or breathing.  If you are having significant difficulty breathing, return to the emergency room for further evaluation. Make sure you quarantine. Return to the emergency room with any new, worsening, or concerning symptoms.

## 2018-12-13 NOTE — ED Triage Notes (Signed)
Pt states work closed due to possible covid among employees.  Pt states has been in contact with this person.  Began having cough late last night.  Denies fevers,  Has not taken otc medications.

## 2018-12-15 LAB — NOVEL CORONAVIRUS, NAA (HOSP ORDER, SEND-OUT TO REF LAB; TAT 18-24 HRS): SARS-CoV-2, NAA: NOT DETECTED

## 2019-08-04 NOTE — Progress Notes (Signed)
New Patient Note  RE: Kathleen Cabrera MRN: [id number] DOB: Apr 28, 1975 Date of Office Visit: 08/05/2019  Referring provider: [facility] Primary care provider: Dagoberto Reef, PA-C  Chief Complaint: Rash (since a teenager.  Seen a dermatologist at [facility] on [address].  Nickle allergy, Eczema and yeast diagnosis in past.)  History of Present Illness: I had the pleasure of seeing Kathleen Cabrera for initial evaluation at the [facility] on 08/05/2019. Kathleen Cabrera is a 44 y.o. female, who is self-referred here for the evaluation of rash.  Rash started about 30 years ago. Mainly occurs on her breast, stomach and neck. Describes them as pruritic, raised at times. Individual rashes lasts about 1-2 week after topical medications. No ecchymosis upon resolution but leaving scarring. Associated symptoms include: none. Suspected triggers are unknown. Denies any fevers, chills, changes in medications, foods, personal care products or recent infections. Kathleen Cabrera has tried the following therapies: triamcinolone with some benefit. Systemic steroids none. Currently on no daily medications.  Previous work up includes: patient saw dermatology for this. No prior biopsy or patch testing.  Patient is up to date with the following cancer screening tests: pap smears.  Assessment and Plan: Kathleen Cabrera is a 44 y.o. female with: Pruritic rash Pruritic rash for 30 years now which mainly occurs on her breast, stomach and neck area. Using topical triamcinolone with some benefit. Followed by dermatology. No previous patch testing or skin biopsy. Concerned about allergies. Denies any rhinoconjunctivitis symptoms.   Today's skin testing showed: Positive to grass pollen only. Negative to foods. Results given.   Patient had some dermatographism after skin testing.   Discussed with patient that it's unlikely that her rash is triggered by the grass pollen.  Get bloodwork to rule out other etiologies.     Recommend TRUE patch testing next.   Start taking Zyrtec (cetirizine) 10mg  daily.  May use hydroxyzine 10mg  1 hour before bedtime as needed for itching.  May use triamcinolone twice a day as needed. Do not use on the face, neck, armpits or groin area. Do not use more than 3 weeks in a row.   See below for proper skin care.   Dermatographism  See assessment and plan as above. The zyrtec 10mg  daily as prescribed above should also help with this symptom.   Adverse food reaction Unknown reaction to chocolate.   Today's skin testing was negative to chocolate.   Check with your mother what kind of reaction you had.  If it was a mild reaction, then you may try it at home.  For mild symptoms you can take over the counter antihistamines such as Benadryl and monitor symptoms closely. If symptoms worsen or if you have severe symptoms including breathing issues, throat closure, significant swelling, whole body hives, severe diarrhea and vomiting, lightheadedness then seek immediate medical care.  Return for Patch testing.  Meds ordered this encounter  Medications  . hydrOXYzine (ATARAX/VISTARIL) 10 MG tablet    Sig: Take 1 tablet (10 mg total) by mouth at bedtime as needed for itching.    Dispense:  30 tablet    Refill:  5    Lab Orders     CBC with Differential/Platelet     Chronic Urticaria     Comprehensive metabolic panel     Tryptase     Thyroid Cascade Profile     ANA w/Reflex     Alpha-Gal Panel     C3 and C4     C-reactive  protein     Sedimentation rate  Other allergy screening: Asthma: no Rhino conjunctivitis: no Food allergy:  Chocolate - ? Unknown reaction.  Medication allergy: no Hymenoptera allergy: no History of recurrent infections suggestive of immunodeficency: no  Diagnostics: Skin Testing: Environmental allergy panel and food panel Positive test to: grass pollen. Negative test to: food panel. Patient had some dermatographism.  Results discussed  with patient/family.  Airborne Adult Perc - 08/05/19 1009    Time Antigen Placed 1009    Allergen Manufacturer Lavella Hammock    Location Back    Number of Test 59    1. Control-Buffer 50% Glycerol Negative    2. Control-Histamine 1 mg/ml 2+    3. Albumin saline Negative    4. Tasley Negative    5. [place] Negative    6. Johnson Negative    7. Kentucky Blue 2+    8. Meadow Fescue 2+    9. Perennial Rye 2+    10. Sweet Vernal Negative    11. Timothy 3+    12. Cocklebur Negative    13. Burweed Marshelder Negative    14. Ragweed, short Negative    15. Ragweed, Giant Negative    16. Plantain,  English Negative    17. Lamb's Quarters Negative    18. Sheep Sorrell Negative    19. Rough Pigweed Negative    20. Marsh Elder, Rough Negative    21. Mugwort, Common Negative    22. Ash mix Negative    23. Birch mix Negative    24. Beech American Negative    25. Box, Elder Negative    26. Cedar, red Negative    27. Cottonwood, [place] Negative    28. Elm mix Negative    29. Hickory Negative    30. Maple mix Negative    31. Oak, [place] mix Negative    32. Pecan Pollen Negative    33. Pine mix Negative    34. Sycamore Eastern Negative    35. Lehigh, Black Pollen Negative    36. Alternaria alternata Negative    37. Cladosporium Herbarum Negative    38. Aspergillus mix Negative    39. Penicillium mix Negative    40. Bipolaris sorokiniana (Helminthosporium) Negative    41. Drechslera spicifera (Curvularia) Negative    42. Mucor plumbeus Negative    43. Fusarium moniliforme Negative    44. Aureobasidium pullulans (pullulara) Negative    45. Rhizopus oryzae Negative    46. Botrytis cinera Negative    47. Epicoccum nigrum Negative    48. Phoma betae Negative    49. Candida Albicans Negative    50. Trichophyton mentagrophytes Negative    51. Mite, D Farinae  5,000 AU/ml Negative    52. Mite, D Pteronyssinus  5,000 AU/ml Negative    53. Cat Hair 10,000 BAU/ml Negative    54.  Dog Epithelia  Negative    55. Mixed Feathers Negative    56. Horse Epithelia Negative    57. Cockroach, German Negative    58. Mouse Negative    59. Tobacco Leaf Negative          Food Adult Perc - 08/05/19 1000    Time Antigen Placed 1010    Allergen Manufacturer Greer    Location Back    Number of allergen test 72    1. Peanut Negative    2. Soybean Negative    3. Wheat Negative    4. Sesame Negative    5. Milk, cow Negative  6. Egg White, Chicken Negative    7. Casein Negative    8. Shellfish Mix Negative    9. Fish Mix Negative    10. Cashew Negative    11. Pecan Food Negative    12. Walnut Food Negative    13. Almond Negative    14. Hazelnut Negative    15. Eston[place] nut Negative    16. Coconut Negative    17. Pistachio Negative    18. Catfish Negative    19. Bass Negative    20. Trout Negative    21. Tuna Negative    22. Salmon Negative    23. Flounder Negative    24. Codfish Negative    25. Shrimp Negative    26. Crab Negative    27. Lobster Negative    28. Oyster Negative    29. Scallops Negative    30. Barley Negative    31. Oat  Negative    32. Rye  Negative    33. Hops Negative    34. Rice Negative    35. Cottonseed Negative    36. Saccharomyces Cerevisiae  Negative    37. Pork Negative    38. Mal[place] Meat Negative    39. Chicken Meat Negative    40. Beef Negative    41. Lamb Negative    42. Tomato Negative    43. White Potato Negative    44. Sweet Potato Negative    45. Pea, Green/English Negative    46. Navy Bean Negative    47. Mushrooms Negative    48. Avocado Negative    49. Onion Negative    50. Cabbage Negative    51. Carrots Negative    52. Celery Negative    53. Corn Negative    54. Cucumber Negative    55. Grape (White seedless) Negative    56. Orange  Negative    57. Banana Negative    58. Apple Negative    59. Peach Negative    60. Strawberry Negative    61. Cantaloupe Negative    62. Watermelon Negative    63. Pineapple Negative     64. Chocolate/Cacao bean Negative    65. Karaya Gum Negative    66. Acacia (Arabic Gum) Negative    67. Cinnamon Negative    68. Nutmeg Negative    69. Ginger Negative    70. Garlic Negative    71. Pepper, black Negative    72. Mustard Negative           Past Medical History: Patient Active Problem List   Diagnosis Date Noted  . Pruritic rash 08/05/2019  . 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 Adverse food reaction 08/05/2019  . DKA (diabetic ketoacidoses) (HCC) 08/17/2018  . Pregnancy 04/24/2013   Past Medical History:  Diagnosis Date  . Diabetes mellitus without complication (HCC)   . GERD (gastroesophageal reflux disease)   . Hypertension    Past Surgical History: Past Surgical History:  Procedure Laterality Date  . CESAREAN SECTION    . CESAREAN SECTION     Medication List:  Current Outpatient Medications  Medication Sig Dispense Refill  . amLODipine (NORVASC) 10 MG tablet Take 1 tablet (10 mg total) by mouth daily. 30 tablet 0  . atorvastatin (LIPITOR) 20 MG tablet Take 20 mg by mouth at bedtime.    . Blood Gluc Meter Disp-Strips (BLOOD GLUCOSE METER DISPOSABLE) DEVI Inject 1 strip into the skin 3 (three) times daily. 100 Device 0  . Blood Glucose  Monitoring Suppl Supplies MISC Blood sugar monitoring device to be used 2 or 3 times a day. 1 Device 0  . clindamycin (CLINDAGEL) 1 % gel Apply topically 2 (two) times daily.    . Lancets (FREESTYLE) lancets 1 each by Other route 3 (three) times daily. 2-3 times a day 100 each 5  . losartan (COZAAR) 25 MG tablet Take 1 tablet (25 mg total) by mouth daily. 30 tablet 0  . medroxyPROGESTERone (DEPO-PROVERA) 150 MG/ML injection Inject 1 mL into the muscle every 3 (three) months.    . metFORMIN (GLUCOPHAGE) 500 MG tablet Take 500 mg by mouth 2 (two) times daily.    Marland Kitchen omeprazole (PRILOSEC) 40 MG capsule Take 40 mg by mouth daily.    Kathleen Cabrera VERIO test strip daily.    Marland Kitchen terbinafine (LAMISIL) 250 MG tablet Take 250 mg by mouth  daily.    Marland Kitchen triamcinolone cream (KENALOG) 0.1 % Apply topically 2 (two) times daily.    . vitamin B-12 (CYANOCOBALAMIN) 100 MCG tablet Take 100 mcg by mouth daily.    . Vitamin D, Ergocalciferol, (DRISDOL) 1.25 MG (50000 UNIT) CAPS capsule Take 50,000 Units by mouth once a week.    Marland Kitchen buPROPion (WELLBUTRIN SR) 150 MG 12 hr tablet Take by mouth. (Patient not taking: Reported on 08/05/2019)    . hydrOXYzine (ATARAX/VISTARIL) 10 MG tablet Take 1 tablet (10 mg total) by mouth at bedtime as needed for itching. 30 tablet 5  . insulin detemir (LEVEMIR) 100 UNIT/ML injection Inject into the skin daily. (Patient not taking: Reported on 08/05/2019)    . Insulin Glargine (LANTUS) 100 UNIT/ML Solostar Pen Inject 24 Units into the skin at bedtime. (Patient not taking: Reported on 08/05/2019) 15 mL 11  . insulin lispro (INSULIN LISPRO) 100 UNIT/ML KwikPen Junior Inject 0.08 mLs (8 Units total) into the skin 3 (three) times daily. (Patient not taking: Reported on 08/05/2019) 15 mL 0  . Insulin Pen Needle (NOVOFINE) 30G X 8 MM MISC Inject 10 each into the skin as needed. (Patient not taking: Reported on 08/05/2019) 100 each 0  . promethazine (PHENERGAN) 25 MG tablet Take 25 mg by mouth every 8 (eight) hours as needed for nausea or vomiting.  (Patient not taking: Reported on 08/05/2019)     No current facility-administered medications for this visit.   Allergies: Allergies  Allergen Reactions  . Chocolate Rash   Social History: Social History   Socioeconomic History  . Marital status: Single    Spouse name: Not on file  . Number of children: Not on file  . Years of education: Not on file  . Highest education level: Not on file  Occupational History  . Not on file  Tobacco Use  . Smoking status: Current Every Day Smoker    Packs/day: 0.50    Years: 24.00    Pack years: 12.00    Types: Cigarettes  . Smokeless tobacco: Never Used  Vaping Use  . Vaping Use: Never used  Substance and Sexual Activity  .  Alcohol use: Yes  . Drug use: No  . Sexual activity: Never    Birth control/protection: Injection  Other Topics Concern  . Not on file  Social History Narrative  . Not on file   Social Determinants of Health   Financial Resource Strain:   . Difficulty of Paying Living Expenses:   Food Insecurity:   . Worried About Programme researcher, broadcasting/film/video in the Last Year:   . [employer] of Food in the  Last Year:   Transportation Needs:   . Freight forwarderLack of Transportation (Medical):   Marland Kitchen. Lack of Transportation (Non-Medical):   Physical Activity:   . Days of Exercise per Week:   . Minutes of Exercise per Session:   Stress:   . Feeling of Stress :   Social Connections:   . Frequency of Communication with Friends and Family:   . Frequency of Social Gatherings with Friends and Family:   . Attends Religious Services:   . Active Member of Clubs or Organizations:   . Attends BankerClub or Organization Meetings:   Marland Kitchen. Marital Status:    Lives in a house built in 1989 for the past 56 years. Smoking: 1/2 pack per day for 30 years Occupation: but Interior and spatial designerdriver  Environmental History: Water Damage/mildew in the house: yes Carpet in the family room: yes Carpet in the bedroom: yes Heating: gas Cooling: central Pet: no  Family History: Family History  Problem Relation Age of Onset  . Hypertension Mother   . Diabetes Mother   . Hyperlipidemia Mother   . Cancer Father   . Hypertension Father   . Diabetes Brother   . Cancer Maternal Aunt   . Hypertension Maternal Grandmother   . Hypertension Maternal Grandfather   . Stroke Maternal Grandfather   . Hypertension Paternal Grandmother   . Diabetes Paternal Grandmother   . Hypertension Paternal Grandfather   . Stroke Paternal Grandfather   . Cancer Other    Problem                               Relation Asthma                                   No  Eczema                                No  Food allergy                          No  Allergic rhino conjunctivitis     No    Review of Systems  Constitutional: Negative for appetite change, chills, fever and unexpected weight change.  HENT: Negative for congestion and rhinorrhea.   Eyes: Negative for itching.  Respiratory: Negative for cough, chest tightness, shortness of breath and wheezing.   Cardiovascular: Negative for chest pain.  Gastrointestinal: Negative for abdominal pain.  Genitourinary: Negative for difficulty urinating.  Skin: Positive for rash.  Allergic/Immunologic: Positive for environmental allergies.  Neurological: Negative for headaches.   Objective: BP 130/84 (BP Location: Right Arm, Patient Position: Sitting, Cuff Size: Normal)   Pulse 94   Temp 98.2 F (36.8 C) (Oral)   Resp 16   Ht 5' 4.6" (1.641 m)   Wt 196 lb 3.4 oz (89 kg)   SpO2 99%   BMI 33.06 kg/m  Body mass index is 33.06 kg/m. Physical Exam Vitals and nursing note reviewed.  Constitutional:      Appearance: Normal appearance. Kathleen Cabrera is well-developed.  HENT:     Head: Normocephalic and atraumatic.     Right Ear: External ear normal.     Left Ear: External ear normal.     Nose: Nose normal.     Mouth/Throat:     Mouth: Mucous membranes are  moist.     Pharynx: Oropharynx is clear.  Eyes:     Conjunctiva/sclera: Conjunctivae normal.  Cardiovascular:     Rate and Rhythm: Normal rate and regular rhythm.     Heart sounds: Normal heart sounds. No murmur heard.  No friction rub. No gallop.   Pulmonary:     Effort: Pulmonary effort is normal.     Breath sounds: Normal breath sounds. No wheezing, rhonchi or rales.  Abdominal:     Palpations: Abdomen is soft.  Musculoskeletal:     Cervical back: Neck supple.  Skin:    General: Skin is warm.     Findings: Rash present.     Comments: Scattered hyperpigmented skin changes on breast b/l and upper extremities b/l.  Neurological:     Mental Status: Kathleen Cabrera is alert and oriented to person, place, and time.  Psychiatric:        Behavior: Behavior normal.    The plan was  reviewed with the patient/family, and all questions/concerned were addressed.  It was my pleasure to see Kathleen Cabrera today and participate in her care. Please feel free to contact me with any questions or concerns.  Sincerely,  Wyline Mood, DO Allergy & Immunology  [facility] office: [phone] [facility] office: [phone] [place] office: [phone]

## 2019-08-05 ENCOUNTER — Ambulatory Visit: Payer: Managed Care, Other (non HMO) | Admitting: Allergy

## 2019-08-05 ENCOUNTER — Other Ambulatory Visit: Payer: Self-pay

## 2019-08-05 ENCOUNTER — Encounter: Payer: Self-pay | Admitting: Allergy

## 2019-08-05 ENCOUNTER — Other Ambulatory Visit: Payer: Self-pay | Admitting: Allergy

## 2019-08-05 VITALS — BP 130/84 | HR 94 | Temp 98.2°F | Resp 16 | Ht 64.6 in | Wt 196.2 lb

## 2019-08-05 DIAGNOSIS — L503 Dermatographic urticaria: Secondary | ICD-10-CM

## 2019-08-05 DIAGNOSIS — T781XXD Other adverse food reactions, not elsewhere classified, subsequent encounter: Secondary | ICD-10-CM | POA: Diagnosis not present

## 2019-08-05 DIAGNOSIS — L282 Other prurigo: Secondary | ICD-10-CM

## 2019-08-05 DIAGNOSIS — T781XXA Other adverse food reactions, not elsewhere classified, initial encounter: Secondary | ICD-10-CM | POA: Insufficient documentation

## 2019-08-05 MED ORDER — HYDROXYZINE HCL 10 MG PO TABS: 10.0000 mg | ORAL_TABLET | Freq: Every evening | ORAL | 5 refills | Status: AC | PRN

## 2019-08-05 NOTE — Assessment & Plan Note (Signed)
   See assessment and plan as above. The zyrtec 10mg  daily as prescribed above should also help with this symptom.

## 2019-08-05 NOTE — Assessment & Plan Note (Signed)
Unknown reaction to chocolate.   Today's skin testing was negative to chocolate.   Check with your mother what kind of reaction you had.  If it was a mild reaction, then you may try it at home.  For mild symptoms you can take over the counter antihistamines such as Benadryl and monitor symptoms closely. If symptoms worsen or if you have severe symptoms including breathing issues, throat closure, significant swelling, whole body hives, severe diarrhea and vomiting, lightheadedness then seek immediate medical care.

## 2019-08-05 NOTE — Patient Instructions (Addendum)
Today's skin testing showed: Positive to grass pollen only. Negative to foods. Results given.   Rash  You most likely have a component of dermatographism.  Start taking Zyrtec (cetirizine) 10mg  daily.  May use hydroxyzine 10mg  1 hour before bedtime as needed for itching.  May use triamcinolone twice a day as needed. Do not use on the face, neck, armpits or groin area. Do not use more than 3 weeks in a row.  Get bloodwork:  We are ordering labs, so please allow 1-2 weeks for the results to come back. With the newly implemented Cures Act, the labs might be visible to you at the same time that they become visible to me. However, I will not address the results until all of the results are back, so please be patient.  In the meantime, continue recommendations in your patient instructions, including avoidance measures (if applicable), until you hear from me.  Patches are best placed on Monday with return to office on Wednesday and Friday of same week for readings.  Patches once placed should not get wet.  You do not have to stop any medications for patch testing but should not be on oral prednisone. You can schedule a patch testing visit when convenient for your schedule.    Environmental allergies  Start environmental control measures as below.  May use over the counter antihistamines such as Zyrtec (cetirizine) 10mg  daily.   Chocolate:  Check with your mother what kind of reaction you had.  If it was a mild reaction, then you may try it at home.  For mild symptoms you can take over the counter antihistamines such as Benadryl and monitor symptoms closely. If symptoms worsen or if you have severe symptoms including breathing issues, throat closure, significant swelling, whole body hives, severe diarrhea and vomiting, lightheadedness then seek immediate medical care.  Follow up for patch testing.  Reducing Pollen Exposure . Pollen seasons: trees (spring), grass (summer) and  ragweed/weeds (fall). Keep windows closed in your home and car to lower pollen exposure.  02-[birth year]-1982 air conditioning in the bedroom and throughout the house if possible.  . Avoid going out in dry windy days - especially early morning. . Pollen counts are highest between 5 - 10 AM and on dry, hot and windy days.  . Save outside activities for late afternoon or after a heavy rain, when pollen levels are lower.  . Avoid mowing of grass if you have grass pollen allergy. 06-[birth year]-1980 Be aware that pollen can also be transported indoors on people and pets.  . Dry your clothes in an automatic dryer rather than hanging them outside where they might collect pollen.  . Rinse hair and eyes before bedtime.    Skin care recommendations  Bath time: . Always use lukewarm water. AVOID very hot or cold water. Marland Kitchen Keep bathing time to 5-10 minutes. . Do NOT use bubble bath. . Use a mild soap and use just enough to wash the dirty areas. . Do NOT scrub skin vigorously.  . After bathing, pat dry your skin with a towel. Do NOT rub or scrub the skin.  Moisturizers and prescriptions:  . ALWAYS apply moisturizers immediately after bathing (within 3 minutes). This helps to lock-in moisture. . Use the moisturizer several times a day over the whole body. Lilian Kapur summer moisturizers include: Aveeno, CeraVe, Cetaphil. Marland Kitchen winter moisturizers include: Aquaphor, Vaseline, Cerave, Cetaphil, Eucerin, Vanicream. . When using moisturizers along with medications, the moisturizer should be applied about one  hour after applying the medication to prevent diluting effect of the medication or moisturize around where you applied the medications. When not using medications, the moisturizer can be continued twice daily as maintenance.  Laundry and clothing: . Avoid laundry products with added color or perfumes. . Use unscented hypo-allergenic laundry products such as Tide free, Cheer free & gentle, and All free and clear.  . If the skin  still seems dry or sensitive, you can try double-rinsing the clothes. . Avoid tight or scratchy clothing such as wool. . Do not use fabric softeners or dyer sheets.

## 2019-08-05 NOTE — Assessment & Plan Note (Signed)
Pruritic rash for 30 years now which mainly occurs on her breast, stomach and neck area. Using topical triamcinolone with some benefit. Followed by dermatology. No previous patch testing or skin biopsy. Concerned about allergies. Denies any rhinoconjunctivitis symptoms.   Today's skin testing showed: Positive to grass pollen only. Negative to foods. Results given.   Patient had some dermatographism after skin testing.   Discussed with patient that it's unlikely that her rash is triggered by the grass pollen.  Get bloodwork to rule out other etiologies.   Recommend TRUE patch testing next.   Start taking Zyrtec (cetirizine) 10mg  daily.  May use hydroxyzine 10mg  1 hour before bedtime as needed for itching.  May use triamcinolone twice a day as needed. Do not use on the face, neck, armpits or groin area. Do not use more than 3 weeks in a row.   See below for proper skin care.

## 2019-08-12 LAB — CBC WITH DIFFERENTIAL/PLATELET
Basophils Absolute: 0.1 10*3/uL (ref 0.0–0.2)
Basos: 1 %
EOS (ABSOLUTE): 0.2 10*3/uL (ref 0.0–0.4)
Eos: 2 %
Hematocrit: 39.2 % (ref 34.0–46.6)
Hemoglobin: 13.7 g/dL (ref 11.1–15.9)
Immature Grans (Abs): 0 10*3/uL (ref 0.0–0.1)
Immature Granulocytes: 0 %
Lymphocytes Absolute: 2.9 10*3/uL (ref 0.7–3.1)
Lymphs: 27 %
MCH: 34.3 pg — ABNORMAL HIGH (ref 26.6–33.0)
MCHC: 34.9 g/dL (ref 31.5–35.7)
MCV: 98 fL — ABNORMAL HIGH (ref 79–97)
Monocytes Absolute: 0.6 10*3/uL (ref 0.1–0.9)
Monocytes: 6 %
Neutrophils Absolute: 7 10*3/uL (ref 1.4–7.0)
Neutrophils: 64 %
Platelets: 346 10*3/uL (ref 150–450)
RBC: 3.99 x10E6/uL (ref 3.77–5.28)
RDW: 12.8 % (ref 11.7–15.4)
WBC: 10.7 10*3/uL (ref 3.4–10.8)

## 2019-08-12 LAB — C3 AND C4
Complement C3, Serum: 157 mg/dL (ref 82–167)
Complement C4, Serum: 46 mg/dL — ABNORMAL HIGH (ref 12–38)

## 2019-08-12 LAB — COMPREHENSIVE METABOLIC PANEL
ALT: 29 IU/L (ref 0–32)
AST: 28 IU/L (ref 0–40)
Albumin/Globulin Ratio: 1.8 (ref 1.2–2.2)
Albumin: 4.4 g/dL (ref 3.8–4.8)
Alkaline Phosphatase: 105 IU/L (ref 48–121)
BUN/Creatinine Ratio: 9 (ref 9–23)
BUN: 9 mg/dL (ref 6–24)
Bilirubin Total: 0.3 mg/dL (ref 0.0–1.2)
CO2: 21 mmol/L (ref 20–29)
Calcium: 9.7 mg/dL (ref 8.7–10.2)
Chloride: 103 mmol/L (ref 96–106)
Creatinine, Ser: 0.95 mg/dL (ref 0.57–1.00)
GFR calc Af Amer: 84 mL/min/{1.73_m2} (ref >59)
GFR calc non Af Amer: 73 mL/min/{1.73_m2} (ref >59)
Globulin, Total: 2.5 g/dL (ref 1.5–4.5)
Glucose: 123 mg/dL — ABNORMAL HIGH (ref 65–99)
Potassium: 4 mmol/L (ref 3.5–5.2)
Sodium: 138 mmol/L (ref 134–144)
Total Protein: 6.9 g/dL (ref 6.0–8.5)

## 2019-08-12 LAB — TRYPTASE: Tryptase: 5.4 ug/L (ref 2.2–13.2)

## 2019-08-12 LAB — ALPHA-GAL PANEL
Alpha Gal IgE*: <0.1 kU/L (ref <0.10)
Beef (Bos spp) IgE: <0.1 kU/L (ref <0.35)
Class Interpretation: 0
Class Interpretation: 0
Class Interpretation: 0
Lamb/Mutton (Ovis spp) IgE: <0.1 kU/L (ref <0.35)
Pork (Sus spp) IgE: <0.1 kU/L (ref <0.35)

## 2019-08-12 LAB — THYROID CASCADE PROFILE: TSH: 1.02 u[IU]/mL (ref 0.450–4.500)

## 2019-08-12 LAB — C-REACTIVE PROTEIN: CRP: 16 mg/L — ABNORMAL HIGH (ref 0–10)

## 2019-08-12 LAB — ANA W/REFLEX: Anti Nuclear Antibody (ANA): NEGATIVE

## 2019-08-12 LAB — SEDIMENTATION RATE: Sed Rate: 52 mm/hr — ABNORMAL HIGH (ref 0–32)

## 2019-08-12 LAB — CHRONIC URTICARIA: cu index: 3 (ref <10)

## 2019-08-24 ENCOUNTER — Ambulatory Visit: Payer: Managed Care, Other (non HMO) | Admitting: Family Medicine

## 2019-08-24 ENCOUNTER — Other Ambulatory Visit: Payer: Self-pay

## 2019-08-24 ENCOUNTER — Encounter: Payer: Managed Care, Other (non HMO) | Admitting: Family Medicine

## 2019-08-24 ENCOUNTER — Encounter: Payer: Self-pay | Admitting: Family Medicine

## 2019-08-24 VITALS — BP 122/68 | HR 81 | Temp 98.4°F | Resp 16

## 2019-08-24 DIAGNOSIS — L253 Unspecified contact dermatitis due to other chemical products: Secondary | ICD-10-CM | POA: Diagnosis not present

## 2019-08-24 NOTE — Patient Instructions (Addendum)
Allergic contact dermatitis - Instructions provided on care of the patches for the next 48 hours. Kathleen Cabrera was instructed to avoid showering for the next 48 hours. Kathleen Cabrera will follow up in 48 hours and 96 hours for patch readings.   Call the clinic if this treatment plan is not working well for you  Follow up in 2 days or sooner if needed.

## 2019-08-24 NOTE — Progress Notes (Addendum)
Follow-up Note  RE: Kanyon Bunn MRN: [id number] DOB: 10/19/1975 Date of Office Visit: 08/24/2019  Primary care provider: Dagoberto Reef, PA-C Referring provider: Dagoberto Reef, Red Christians returns to the office today for the patch test placement, given suspected history of contact dermatitis. She was last seen in this clinic on 08/05/2019 by Dr. Selena Batten for evaluation of hives, allergic rhinitis, and dermatographism. At today's visit, she reports that, while she continues to experience the red, raised itchy rash, the frequency and severity have decreased since she has started taking cetirizine once a day. All questions about patch placement have been answered. We will plan to recheck sed rate and crp labs next week post testing. Her current medications are listed in the chart.   Diagnostics: True Test patches placed.    Plan:   Allergic contact dermatitis - Instructions provided on care of the patches for the next 48 hours. Arnell Mausolf was instructed to avoid showering for the next 48 hours. Keirstyn Aydt will follow up in 48 hours and 96 hours for patch readings.   Thank you for the opportunity to care for this patient.  Please do not hesitate to contact me with questions.  Thermon Leyland, FNP [facility]  ________________________________________________  I have provided oversight concerning [name]'s evaluation and treatment of this patient's health issues addressed during today's encounter.  I agree with the assessment and therapeutic plan as outlined in the note.   Signed,   R Jorene Guest, MD

## 2019-08-26 ENCOUNTER — Ambulatory Visit: Payer: Managed Care, Other (non HMO) | Admitting: Family Medicine

## 2019-08-26 ENCOUNTER — Encounter: Payer: Self-pay | Admitting: Family Medicine

## 2019-08-26 ENCOUNTER — Other Ambulatory Visit: Payer: Self-pay

## 2019-08-26 DIAGNOSIS — L253 Unspecified contact dermatitis due to other chemical products: Secondary | ICD-10-CM

## 2019-08-26 NOTE — Patient Instructions (Signed)
Allergic contact dermatitis The patches have been removed at this time. There were no positive skin reactions at this time. Return to the clinic for the final reading on Friday August 28, 2019  Call the clinic if this treatment plan is not working well for you  Follow up in 2 days or sooner if needed.

## 2019-08-26 NOTE — Progress Notes (Addendum)
    Follow-up Note  RE: Kathleen Cabrera MRN: [id number] DOB: 02-10-1976 Date of Office Visit: 08/26/2019  Primary care provider: Dagoberto Reef, PA-C Referring provider: Dagoberto Cabrera, Kathleen Cabrera returns to the office today for the initial patch test interpretation, given suspected history of contact dermatitis. She did not have any trouble with keeping the patches in place and denies pruritis in the patch area. Patches removed.    Diagnostics:   TRUE TEST 48-hour hour reading: Negative to all patches at today's appointment.   Plan:   Allergic contact dermatitis The patches have been removed at this time. There were no positive skin reactions at this time. Return to the clinic for the final reading on Friday August 28, 2019  Thank you for the opportunity to care for this patient.  Please do not hesitate to contact me with questions.  Kathleen Leyland, FNP [facility]  ________________________________________________  I have provided oversight concerning Kathleen Cabrera's evaluation and treatment of this patient's health issues addressed during today's encounter.  I agree with the assessment and therapeutic plan as outlined in the note.   Signed,   Kathleen Jorene Guest, MD

## 2019-08-28 ENCOUNTER — Encounter: Payer: Self-pay | Admitting: Family Medicine

## 2019-08-28 ENCOUNTER — Other Ambulatory Visit: Payer: Self-pay

## 2019-08-28 ENCOUNTER — Ambulatory Visit (INDEPENDENT_AMBULATORY_CARE_PROVIDER_SITE_OTHER): Payer: Managed Care, Other (non HMO) | Admitting: Family Medicine

## 2019-08-28 DIAGNOSIS — L282 Other prurigo: Secondary | ICD-10-CM

## 2019-08-28 NOTE — Patient Instructions (Addendum)
Allergic contact dermatitis Patch testing was negative at the 96 hour reading Continue cetirizine 10 mg once a day as needed for itch Continue triamcinolone 0.1% ointment to red itchy areas below your face twice a day as needed. Stop using this medication when the rash resolves. Do not use this medication for longer than 3 weeks at a time New lab orders have been placed to recheck the 2 lab results that were elevated  (sedimentation rate and your c-reactive protein levels) within the next 1-2 months. We will call you when these lab results become available Continue allergen avoidance measures directed toward grass pollens as listed below Continue to follow up with your dermatologist for further evaluation  Call the clinic if this treatment plan is not working well for you  Follow up in 3 months or sooner if needed.  Reducing Pollen Exposure The [employer] of [employer] suggests the following steps to reduce your exposure to pollen during allergy seasons. 1. Do not hang sheets or clothing out to dry; pollen may collect on these items. 2. Do not mow lawns or spend time around freshly cut grass; mowing stirs up pollen. 3. Keep windows closed at night.  Keep car windows closed while driving. 4. Minimize morning activities outdoors, a time when pollen counts are usually at their highest. 5. Stay indoors as much as possible when pollen counts or humidity is high and on windy days when pollen tends to remain in the air longer. 6. Use air conditioning when possible.  Many air conditioners have filters that trap the pollen spores. 7. Use a HEPA room air filter to remove pollen form the indoor air you breathe.

## 2019-08-28 NOTE — Progress Notes (Signed)
    Follow-up Note  RE: Rohini Jaroszewski MRN: [id number] DOB: [birth year] Date of Office Visit: 08/28/2019  Primary care provider: Dagoberto Reef, PA-C Referring provider: Dagoberto Reef, Red Christians returns to the office today for the final patch test interpretation, given suspected history of contact dermatitis.    Diagnostics:   TRUE TEST 96-hour hour reading: Negative to all panels  Plan:   Allergic contact dermatitis Patch testing was negative at the 96 hour reading Continue cetirizine 10 mg once a day as needed for itch Continue triamcinolone 0.1% ointment to red itchy areas below your face twice a day as needed. Stop using this medication when the rash resolves. Do not use this medication for longer than 3 weeks at a time New lab orders have been placed to recheck the 2 lab results that were elevated  (sedimentation rate and your c-reactive protein levels) within the next 1-2 months. We will call you when these lab results become available Continue allergen avoidance measures directed toward grass pollens as listed below Continue to follow up with your dermatologist for further evaluation  Call the clinic if this treatment plan is not working well for you  Follow up in 3 months or sooner if needed.

## 2019-09-01 NOTE — Addendum Note (Signed)
Addended by: Vincent Peyer A on: 09/01/2019 12:29 PM   Modules accepted: Orders

## 2020-01-05 IMAGING — CT CT ABDOMEN AND PELVIS WITHOUT CONTRAST
2 of 4 series · 16 of 46 positions shown, 18 images · non-contrast
Comparison: None.

CLINICAL DATA: epigastric pain with nausea and vomiting since
[REDACTED]. Quit drinking 2 days ago. Elevated creatinine.

EXAM:
CT ABDOMEN AND PELVIS WITHOUT CONTRAST
TECHNIQUE: Multidetector CT imaging of the abdomen and pelvis was performed
following the standard protocol without IV contrast.

[Series 2: axial st · axial · 0.82mm/px · z∈[-470,-20]mm · 13 of 100 slices shown, 15 images]
[im 5/100  soft-tissue]
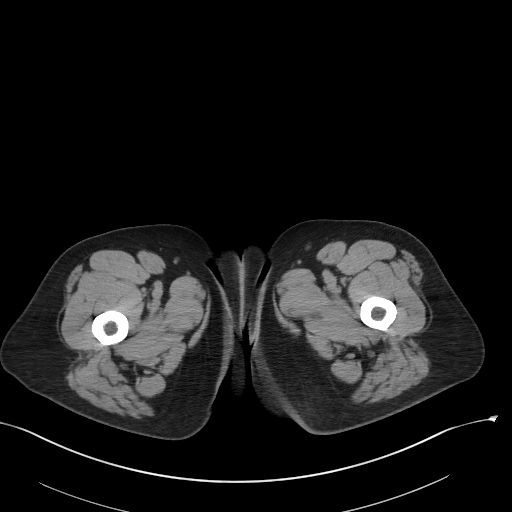
[im 5/100  bone]
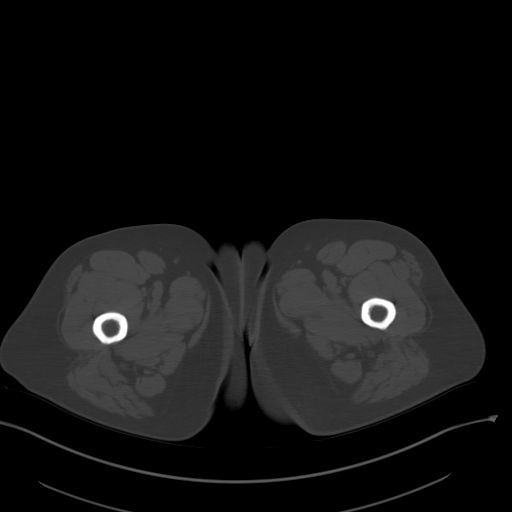
[im 13/100  soft-tissue]
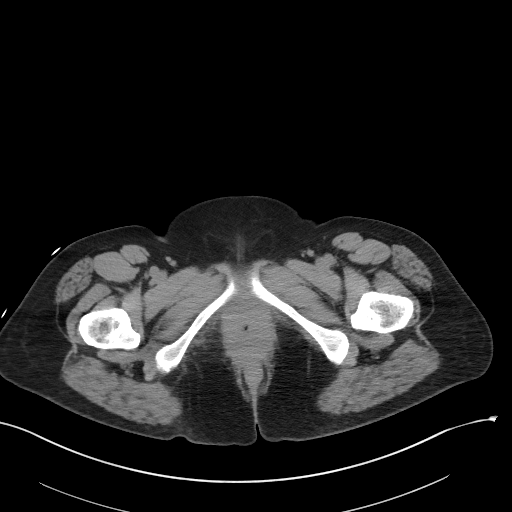
[im 21/100  soft-tissue]
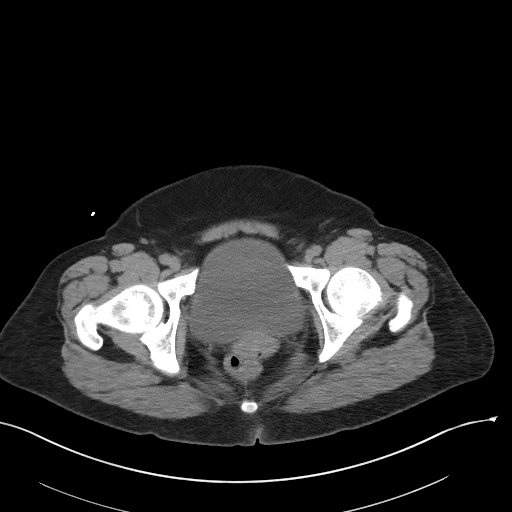
[im 29/100  soft-tissue]
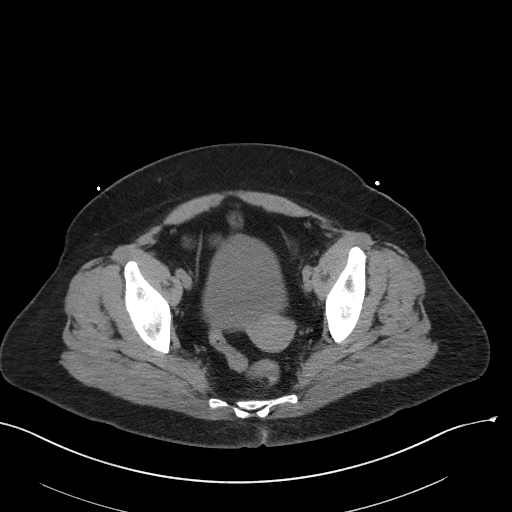
[im 34/100  soft-tissue]
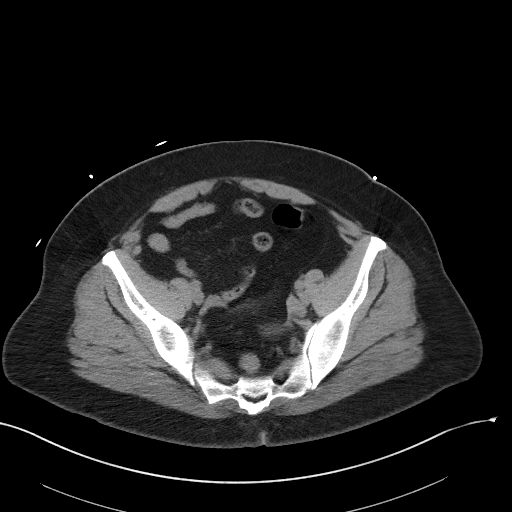
[im 42/100  soft-tissue]
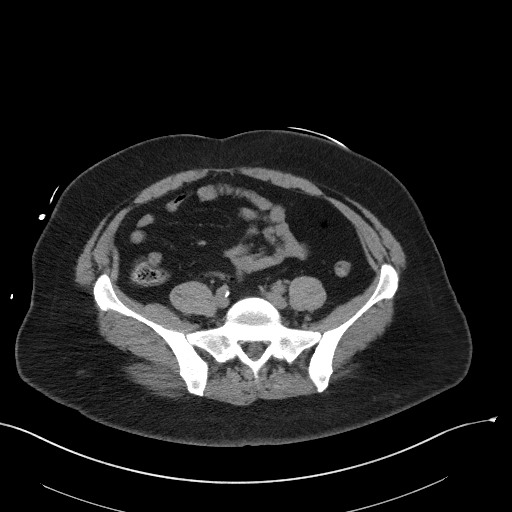
[im 50/100  soft-tissue]
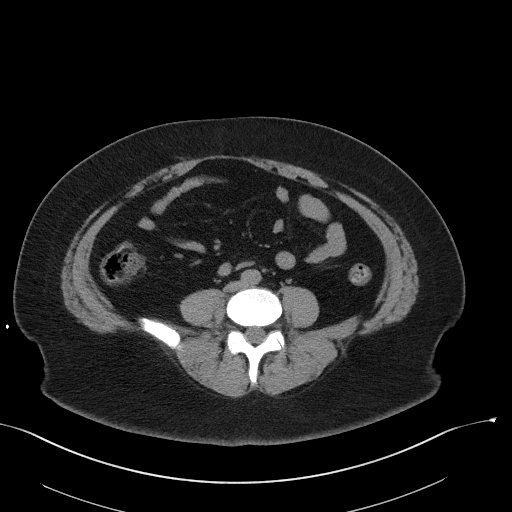
[im 58/100  soft-tissue]
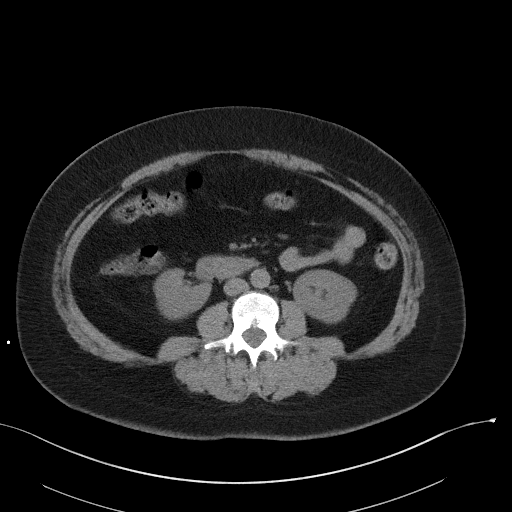
[im 67/100  soft-tissue]
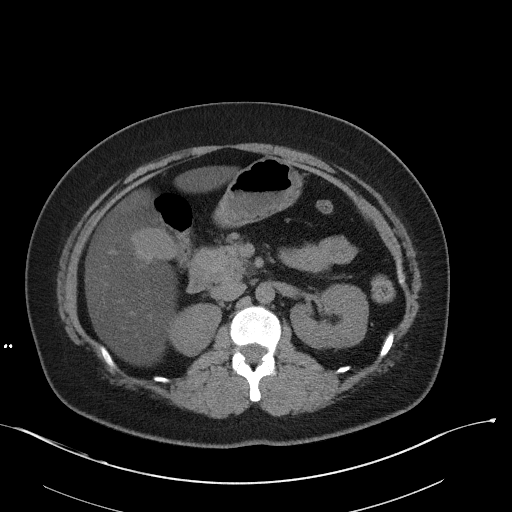
[im 67/100  bone]
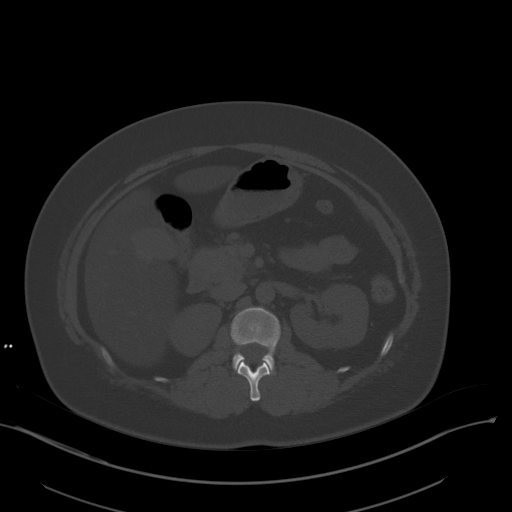
[im 71/100  soft-tissue]
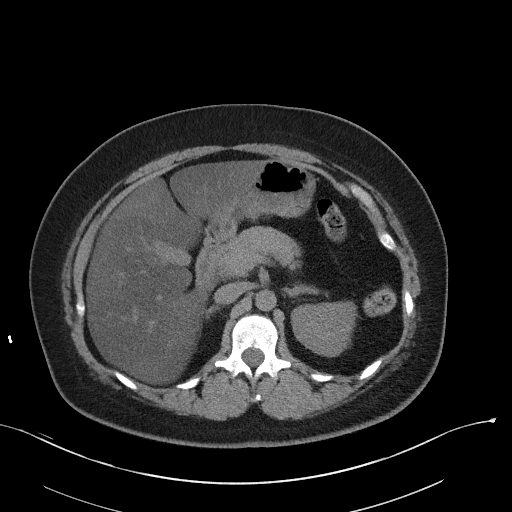
[im 79/100  soft-tissue]
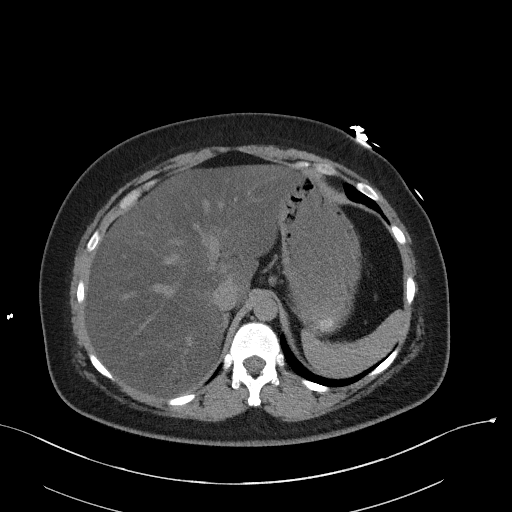
[im 87/100  soft-tissue]
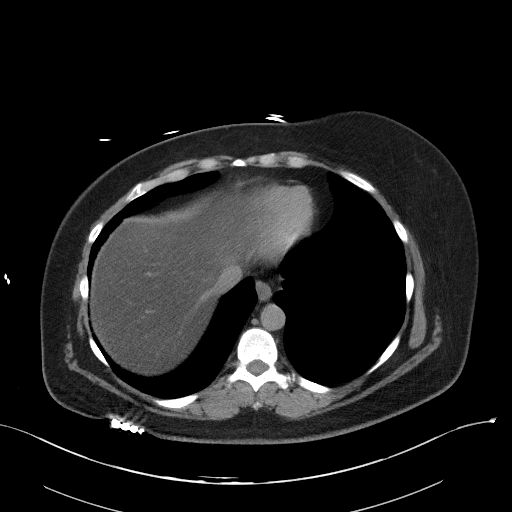
[im 95/100  soft-tissue]
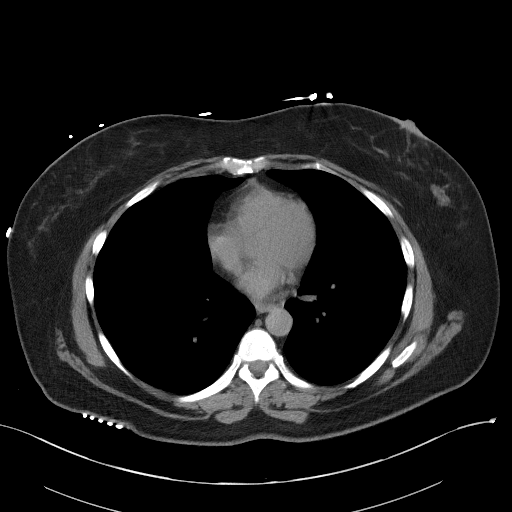

[Series 5: coronal st · coronal · 0.90mm/px · 3 of 97 slices shown]
[im 33/97  soft-tissue]
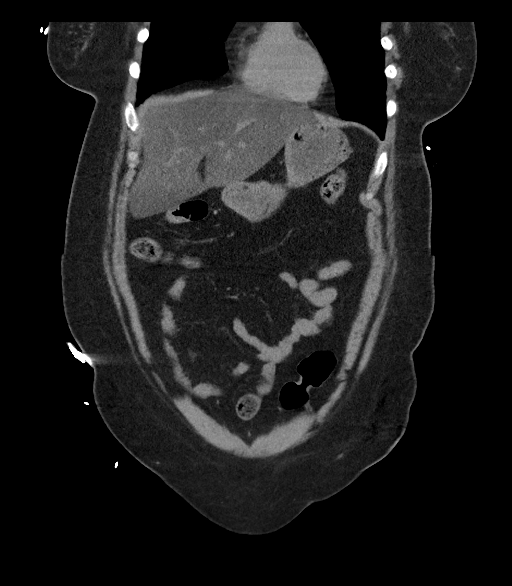
[im 43/97  soft-tissue]
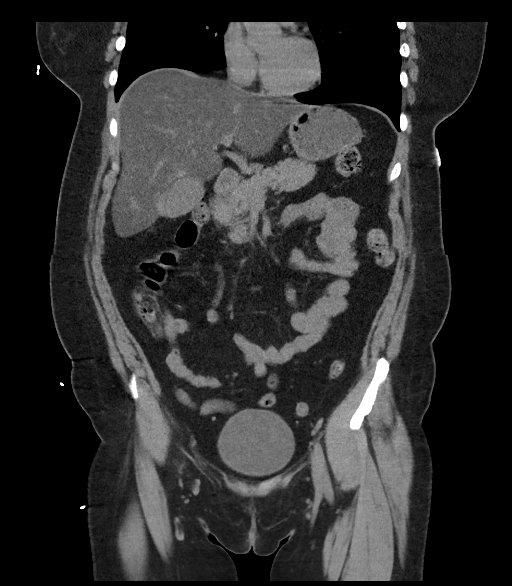
[im 54/97  soft-tissue]
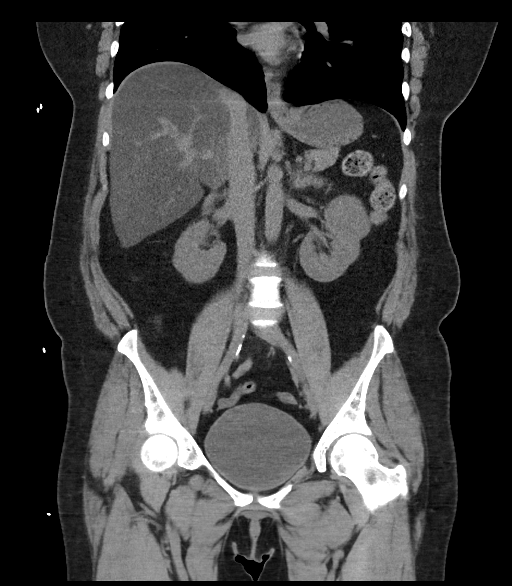

[16 of 46 positions shown; findings below may reference images not displayed]

FINDINGS: Lower chest: Clear lung bases. Normal heart size without pericardial
or pleural effusion.

Hepatobiliary: Marked hepatic steatosis. Normal gallbladder, without
biliary ductal dilatation.

Pancreas: Mild pancreatic soft tissue fullness throughout with
possible subtle peripancreatic edema. No duct dilatation or
well-defined peripancreatic fluid collection.

Spleen: Normal in size, without focal abnormality.

Adrenals/Urinary Tract: Normal adrenal glands. Interpolar punctate
right renal collecting system calculus. No left renal calculi or
hydronephrosis. No bladder calculi.

Stomach/Bowel: Normal stomach, without wall thickening. Normal
colon, appendix, and terminal ileum. Normal small bowel. No free
intraperitoneal air.

Vascular/Lymphatic: Aortic atherosclerosis. No abdominopelvic
adenopathy.

Reproductive: Normal uterus and adnexa.

Other: No significant free fluid.

Musculoskeletal: No acute osseous abnormality. Mild disc bulges at
L4-5 and L5-S1.
IMPRESSION: 1. Suggestion of pancreatic soft tissue fullness and subtle
peripancreatic edema. Correlate with laboratory values to exclude
mild pancreatitis.
2. No other explanation for patient's symptoms.
3. Marked hepatic steatosis.
4.  Aortic Atherosclerosis ([id number]-EQX.X).  This is age advanced.
5. Right nephrolithiasis.

## 2020-07-05 ENCOUNTER — Encounter (HOSPITAL_BASED_OUTPATIENT_CLINIC_OR_DEPARTMENT_OTHER): Payer: Self-pay

## 2020-07-05 ENCOUNTER — Other Ambulatory Visit: Payer: Self-pay

## 2020-07-05 ENCOUNTER — Other Ambulatory Visit (HOSPITAL_BASED_OUTPATIENT_CLINIC_OR_DEPARTMENT_OTHER): Payer: Self-pay

## 2020-07-05 ENCOUNTER — Emergency Department (HOSPITAL_BASED_OUTPATIENT_CLINIC_OR_DEPARTMENT_OTHER)
Admission: EM | Admit: 2020-07-05 | Discharge: 2020-07-05 | Disposition: A | Discharge status: Home / Self Care | Payer: Managed Care, Other (non HMO) | Attending: Emergency Medicine | Admitting: Emergency Medicine

## 2020-07-05 ENCOUNTER — Emergency Department (HOSPITAL_BASED_OUTPATIENT_CLINIC_OR_DEPARTMENT_OTHER): Payer: Managed Care, Other (non HMO)

## 2020-07-05 DIAGNOSIS — I10 Essential (primary) hypertension: Secondary | ICD-10-CM | POA: Insufficient documentation

## 2020-07-05 DIAGNOSIS — R072 Precordial pain: Secondary | ICD-10-CM | POA: Insufficient documentation

## 2020-07-05 DIAGNOSIS — E1110 Type 2 diabetes mellitus with ketoacidosis without coma: Secondary | ICD-10-CM | POA: Insufficient documentation

## 2020-07-05 DIAGNOSIS — R110 Nausea: Secondary | ICD-10-CM | POA: Insufficient documentation

## 2020-07-05 DIAGNOSIS — R1013 Epigastric pain: Secondary | ICD-10-CM | POA: Diagnosis present

## 2020-07-05 DIAGNOSIS — Z79899 Other long term (current) drug therapy: Secondary | ICD-10-CM | POA: Insufficient documentation

## 2020-07-05 DIAGNOSIS — F17210 Nicotine dependence, cigarettes, uncomplicated: Secondary | ICD-10-CM | POA: Insufficient documentation

## 2020-07-05 DIAGNOSIS — Z7984 Long term (current) use of oral hypoglycemic drugs: Secondary | ICD-10-CM | POA: Diagnosis not present

## 2020-07-05 DIAGNOSIS — Z794 Long term (current) use of insulin: Secondary | ICD-10-CM | POA: Diagnosis not present

## 2020-07-05 LAB — CBC WITH DIFFERENTIAL/PLATELET
Abs Immature Granulocytes: 0.02 10*3/uL (ref 0.00–0.07)
Basophils Absolute: 0 10*3/uL (ref 0.0–0.1)
Basophils Relative: 0 %
Eosinophils Absolute: 0.2 10*3/uL (ref 0.0–0.5)
Eosinophils Relative: 2 %
HCT: 39 % (ref 36.0–46.0)
Hemoglobin: 14.1 g/dL (ref 12.0–15.0)
Immature Granulocytes: 0 %
Lymphocytes Relative: 24 %
Lymphs Abs: 2.6 10*3/uL (ref 0.7–4.0)
MCH: 34.9 pg — ABNORMAL HIGH (ref 26.0–34.0)
MCHC: 36.2 g/dL — ABNORMAL HIGH (ref 30.0–36.0)
MCV: 96.5 fL (ref 80.0–100.0)
Monocytes Absolute: 0.6 10*3/uL (ref 0.1–1.0)
Monocytes Relative: 5 %
Neutro Abs: 7.4 10*3/uL (ref 1.7–7.7)
Neutrophils Relative %: 69 %
Platelets: 323 10*3/uL (ref 150–400)
RBC: 4.04 MIL/uL (ref 3.87–5.11)
RDW: 12.9 % (ref 11.5–15.5)
WBC: 10.9 10*3/uL — ABNORMAL HIGH (ref 4.0–10.5)
nRBC: 0 % (ref 0.0–0.2)

## 2020-07-05 LAB — COMPREHENSIVE METABOLIC PANEL
ALT: 33 U/L (ref 0–44)
AST: 32 U/L (ref 15–41)
Albumin: 3.9 g/dL (ref 3.5–5.0)
Alkaline Phosphatase: 63 U/L (ref 38–126)
Anion gap: 10 (ref 5–15)
BUN: 7 mg/dL (ref 6–20)
CO2: 23 mmol/L (ref 22–32)
Calcium: 9.1 mg/dL (ref 8.9–10.3)
Chloride: 106 mmol/L (ref 98–111)
Creatinine, Ser: 0.62 mg/dL (ref 0.44–1.00)
GFR, Estimated: >60 mL/min (ref >60)
Glucose, Bld: 106 mg/dL — ABNORMAL HIGH (ref 70–99)
Potassium: 3.2 mmol/L — ABNORMAL LOW (ref 3.5–5.1)
Sodium: 139 mmol/L (ref 135–145)
Total Bilirubin: 0.8 mg/dL (ref 0.3–1.2)
Total Protein: 7.5 g/dL (ref 6.5–8.1)

## 2020-07-05 LAB — TROPONIN I (HIGH SENSITIVITY): Troponin I (High Sensitivity): 4 ng/L (ref <18)

## 2020-07-05 LAB — PREGNANCY, URINE: Preg Test, Ur: NEGATIVE

## 2020-07-05 LAB — CBG MONITORING, ED
Glucose-Capillary: 107 mg/dL — ABNORMAL HIGH (ref 70–99)
Glucose-Capillary: 107 mg/dL — ABNORMAL HIGH (ref 70–99)

## 2020-07-05 LAB — LIPASE, BLOOD: Lipase: 31 U/L (ref 11–51)

## 2020-07-05 MED ORDER — SODIUM CHLORIDE 0.9 % IV BOLUS
1000.0000 mL | Freq: Once | INTRAVENOUS | Status: AC
  Administered 2020-07-05: 1000 mL via INTRAVENOUS

## 2020-07-05 MED ORDER — ALUM & MAG HYDROXIDE-SIMETH 200-200-20 MG/5ML PO SUSP
30.0000 mL | Freq: Once | ORAL | Status: AC
  Administered 2020-07-05: 30 mL via ORAL
  Filled 2020-07-05: qty 30

## 2020-07-05 MED ORDER — SUCRALFATE 1 G PO TABS
1.0000 g | ORAL_TABLET | Freq: Three times a day (TID) | ORAL | 0 refills | Status: AC
  Filled 2020-07-05: qty 56, 14d supply, fill #0

## 2020-07-05 MED ORDER — POTASSIUM CHLORIDE CRYS ER 20 MEQ PO TBCR
40.0000 meq | EXTENDED_RELEASE_TABLET | Freq: Once | ORAL | Status: AC
  Administered 2020-07-05: 40 meq via ORAL
  Filled 2020-07-05: qty 2

## 2020-07-05 MED ORDER — ONDANSETRON HCL 4 MG/2ML IJ SOLN
4.0000 mg | Freq: Once | INTRAMUSCULAR | Status: AC
  Administered 2020-07-05: 4 mg via INTRAVENOUS
  Filled 2020-07-05: qty 2

## 2020-07-05 MED ORDER — PANTOPRAZOLE SODIUM 40 MG IV SOLR
40.0000 mg | Freq: Once | INTRAVENOUS | Status: AC
  Administered 2020-07-05: 40 mg via INTRAVENOUS
  Filled 2020-07-05: qty 40

## 2020-07-05 MED ORDER — FENTANYL CITRATE (PF) 100 MCG/2ML IJ SOLN
50.0000 ug | Freq: Once | INTRAMUSCULAR | Status: AC
  Administered 2020-07-05: 50 ug via INTRAVENOUS
  Filled 2020-07-05: qty 2

## 2020-07-05 MED ORDER — LIDOCAINE VISCOUS HCL 2 % MT SOLN
15.0000 mL | Freq: Once | OROMUCOSAL | Status: AC
  Administered 2020-07-05: 15 mL via ORAL
  Filled 2020-07-05: qty 15

## 2020-07-05 NOTE — ED Notes (Signed)
ED Provider at bedside. 

## 2020-07-05 NOTE — ED Triage Notes (Signed)
Pt reports mid epigastric chest pain that started at 3 am today.  She had some associated nausea but denies vomiting.  She reports some diaphoresis.  Denies pain anywhere else.  She rates the pain 10/10.  She has not taken anything for the pain.

## 2020-07-05 NOTE — ED Provider Notes (Signed)
[facility] EMERGENCY DEPARTMENT Provider Note   CSN: [id number] Arrival date & time: 07/05/20  [id number]     History Chief Complaint  Patient presents with  . Chest Pain    Kathleen Cabrera is a 45 y.o. female.  The history is provided by the patient.  Chest Pain Pain location:  Epigastric and substernal area Pain quality: aching   Pain radiates to:  Does not radiate Pain severity:  Mild Onset quality:  Gradual Timing:  Constant Progression:  Unchanged Chronicity:  New Context: at rest   Relieved by:  Nothing Worsened by:  Certain positions Associated symptoms: abdominal pain and nausea   Associated symptoms: no back pain, no cough, no fever, no palpitations, no shortness of breath and no vomiting   Risk factors: diabetes mellitus and hypertension        Past Medical History:  Diagnosis Date  . Diabetes mellitus without complication (HCC)   . GERD (gastroesophageal reflux disease)   . Hypertension     Patient Active Problem List   Diagnosis Date Noted  . Contact dermatitis due to chemicals 08/24/2019  . Pruritic rash 08/05/2019  . 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 Adverse food reaction 08/05/2019  . DKA (diabetic ketoacidoses) 08/17/2018  . Pregnancy 04/24/2013    Past Surgical History:  Procedure Laterality Date  . CESAREAN SECTION    . CESAREAN SECTION       OB History   No obstetric history on file.     Family History  Problem Relation Age of Onset  . Hypertension Mother   . Diabetes Mother   . Hyperlipidemia Mother   . Cancer Father   . Hypertension Father   . Diabetes Brother   . Cancer Maternal Aunt   . Hypertension Maternal Grandmother   . Hypertension Maternal Grandfather   . Stroke Maternal Grandfather   . Hypertension Paternal Grandmother   . Diabetes Paternal Grandmother   . Hypertension Paternal Grandfather   . Stroke Paternal Grandfather   . Cancer Other     Social History   Tobacco Use  . Smoking status: Current Every  Day Smoker    Packs/day: 0.50    Years: 24.00    Pack years: 12.00    Types: Cigarettes  . Smokeless tobacco: Never Used  Vaping Use  . Vaping Use: Never used  Substance Use Topics  . Alcohol use: Yes  . Drug use: No    Home Medications Prior to Admission medications   Medication Sig Start Date End Date Taking? Authorizing Provider  amLODipine (NORVASC) 10 MG tablet Take 1 tablet (10 mg total) by mouth daily. 12/08/17  Yes Robinson, [place] N, PA-C  atorvastatin (LIPITOR) 20 MG tablet Take 20 mg by mouth at bedtime. 07/02/19  Yes [provider]  Blood Gluc Meter Disp-Strips (BLOOD GLUCOSE METER DISPOSABLE) DEVI Inject 1 strip into the skin 3 (three) times daily. 08/21/18  Yes Dorcas CarrowGhimire, Kuber, MD  Blood Glucose Monitoring Suppl Supplies MISC Blood sugar monitoring device to be used 2 or 3 times a day. 08/21/18  Yes Dorcas CarrowGhimire, Kuber, MD  buPROPion (WELLBUTRIN SR) 150 MG 12 hr tablet Take by mouth. 04/01/19  Yes [provider]  Lancets (FREESTYLE) lancets 1 each by Other route 3 (three) times daily. 2-3 times a day 08/21/18  Yes Ghimire, Lyndel SafeKuber, MD  losartan (COZAAR) 25 MG tablet Take 1 tablet (25 mg total) by mouth daily. 12/08/17  Yes Roxan Hockeyobinson, [place] N, PA-C  medroxyPROGESTERone (DEPO-PROVERA) 150 MG/ML injection Inject 1 mL into the  muscle every 3 (three) months. 06/05/18  Yes [provider]  metFORMIN (GLUCOPHAGE) 500 MG tablet Take 500 mg by mouth 2 (two) times daily. 06/20/19  Yes [provider]  sucralfate (CARAFATE) 1 g tablet Take 1 tablet (1 g total) by mouth 4 (four) times daily -  with meals and at bedtime for 14 days. 07/05/20 07/19/20 Yes Deyana Wnuk, DO  terbinafine (LAMISIL) 250 MG tablet Take 250 mg by mouth daily. 07/07/19  Yes [provider]  vitamin B-12 (CYANOCOBALAMIN) 100 MCG tablet Take 100 mcg by mouth daily. 07/02/19  Yes [provider]  Vitamin D, Ergocalciferol, (DRISDOL) 1.25 MG (50000 UNIT) CAPS capsule Take 50,000  Units by mouth once a week. 06/30/19  Yes [provider]  clindamycin (CLINDAGEL) 1 % gel Apply topically 2 (two) times daily. 05/28/19   [provider]  hydrOXYzine (ATARAX/VISTARIL) 10 MG tablet Take 1 tablet (10 mg total) by mouth at bedtime as needed for itching. 08/05/19   Ellamae Sia, DO  insulin detemir (LEVEMIR) 100 UNIT/ML injection Inject into the skin daily. Patient not taking: Reported on 08/05/2019    [provider]  Insulin Glargine (LANTUS) 100 UNIT/ML Solostar Pen Inject 24 Units into the skin at bedtime. Patient not taking: Reported on 08/05/2019 08/21/18   Dorcas Carrow, MD  insulin lispro (INSULIN LISPRO) 100 UNIT/ML KwikPen Junior Inject 0.08 mLs (8 Units total) into the skin 3 (three) times daily. Patient not taking: Reported on 08/05/2019 08/21/18   Dorcas Carrow, MD  Insulin Pen Needle (NOVOFINE) 30G X 8 MM MISC Inject 10 each into the skin as needed. Patient not taking: Reported on 08/05/2019 08/21/18   Dorcas Carrow, MD  omeprazole (PRILOSEC) 40 MG capsule Take 40 mg by mouth daily. 07/07/19   [provider]  [facility] VERIO test strip daily. 07/17/19   [provider]  promethazine (PHENERGAN) 25 MG tablet Take 25 mg by mouth every 8 (eight) hours as needed for nausea or vomiting.  Patient not taking: Reported on 08/05/2019 08/17/18   [provider]  triamcinolone cream (KENALOG) 0.1 % Apply topically 2 (two) times daily. 04/28/19   [provider]    Allergies    Chocolate  Review of Systems   Review of Systems  Constitutional: Negative for chills and fever.  HENT: Negative for ear pain and sore throat.   Eyes: Negative for pain and visual disturbance.  Respiratory: Negative for cough and shortness of breath.   Cardiovascular: Positive for chest pain. Negative for palpitations.  Gastrointestinal: Positive for abdominal pain and nausea. Negative for vomiting.  Genitourinary: Negative for dysuria and hematuria.   Musculoskeletal: Negative for arthralgias and back pain.  Skin: Negative for color change and rash.  Neurological: Negative for seizures and syncope.  All other systems reviewed and are negative.   Physical Exam Updated Vital Signs  ED Triage Vitals  Enc Vitals Group     BP 07/05/20 0743 (!) 154/102     Pulse Rate 07/05/20 0743 91     Resp 07/05/20 0743 19     Temp 07/05/20 0743 98.2 F (36.8 C)     Temp Source 07/05/20 0743 Oral     SpO2 07/05/20 0743 100 %     Weight 07/05/20 0744 181 lb (82.1 kg)     Height 07/05/20 0744 5\' 3"  (1.6 m)     Head Circumference --      Peak Flow --      Pain Score 07/05/20 0744 9  Pain Loc --      Pain Edu? --      Excl. in GC? --     Physical Exam Vitals and nursing note reviewed.  Constitutional:      General: She is not in acute distress.    Appearance: She is well-developed. She is not ill-appearing.  HENT:     Head: Normocephalic and atraumatic.  Eyes:     Extraocular Movements: Extraocular movements intact.     Conjunctiva/sclera: Conjunctivae normal.     Pupils: Pupils are equal, round, and reactive to light.  Cardiovascular:     Rate and Rhythm: Normal rate and regular rhythm.     Pulses:          Radial pulses are 2+ on the right side and 2+ on the left side.     Heart sounds: Normal heart sounds. No murmur heard.   Pulmonary:     Effort: Pulmonary effort is normal. No respiratory distress.     Breath sounds: Normal breath sounds. No decreased breath sounds, wheezing or rhonchi.  Abdominal:     Palpations: Abdomen is soft.     Tenderness: There is abdominal tenderness (epigastric).  Musculoskeletal:        General: Normal range of motion.     Cervical back: Normal range of motion and neck supple.     Right lower leg: No edema.     Left lower leg: No edema.  Skin:    General: Skin is warm and dry.     Capillary Refill: Capillary refill takes less than 2 seconds.  Neurological:     General: No focal deficit  present.     Mental Status: She is alert.     ED Results / Procedures / Treatments   Labs (all labs ordered are listed, but only abnormal results are displayed) Labs Reviewed  CBC WITH DIFFERENTIAL/PLATELET - Abnormal; Notable for the following components:      Result Value   WBC 10.9 (*)    MCH 34.9 (*)    MCHC 36.2 (*)    All other components within normal limits  COMPREHENSIVE METABOLIC PANEL - Abnormal; Notable for the following components:   Potassium 3.2 (*)    Glucose, Bld 106 (*)    All other components within normal limits  CBG MONITORING, ED - Abnormal; Notable for the following components:   Glucose-Capillary 107 (*)    All other components within normal limits  CBG MONITORING, ED - Abnormal; Notable for the following components:   Glucose-Capillary 107 (*)    All other components within normal limits  LIPASE, BLOOD  PREGNANCY, URINE  TROPONIN I (HIGH SENSITIVITY)    EKG EKG Interpretation  Date/Time:  Tuesday Jul 05 2020 07:46:28 EDT Ventricular Rate:  75 PR Interval:  152 QRS Duration: 84 QT Interval:  377 QTC Calculation: 421 R Axis:   26 Text Interpretation: Sinus rhythm Confirmed by Virgina Norfolk ([id number]) on 07/05/2020 7:51:44 AM   Radiology DG Chest Portable 1 View  Result Date: 07/05/2020 CLINICAL DATA:  Epigastric chest pain that began at 3 a.m. today EXAM: PORTABLE CHEST 1 VIEW COMPARISON:  None. FINDINGS: The heart size and mediastinal contours are within normal limits. Both lungs are clear. The visualized skeletal structures are unremarkable. IMPRESSION: No active disease. Electronically Signed   By: Acquanetta Belling M.D.   On: 07/05/2020 08:09    Procedures Procedures   Medications Ordered in ED Medications  ondansetron (ZOFRAN) injection 4 mg (4 mg Intravenous Given 07/05/20  6144)  fentaNYL (SUBLIMAZE) injection 50 mcg (50 mcg Intravenous Given 07/05/20 0803)  sodium chloride 0.9 % bolus 1,000 mL (1,000 mLs Intravenous New Bag/Given 07/05/20 0811)   potassium chloride SA (KLOR-CON) CR tablet 40 mEq (40 mEq Oral Given 07/05/20 0845)  pantoprazole (PROTONIX) injection 40 mg (40 mg Intravenous Given 07/05/20 0845)  alum & mag hydroxide-simeth (MAALOX/MYLANTA) 200-200-20 MG/5ML suspension 30 mL (30 mLs Oral Given 07/05/20 0845)    And  lidocaine (XYLOCAINE) 2 % viscous mouth solution 15 mL (15 mLs Oral Given 07/05/20 0845)    ED Course  I have reviewed the triage vital signs and the nursing notes.  Pertinent labs & imaging results that were available during my care of the patient were reviewed by me and considered in my medical decision making (see chart for details).    MDM Rules/Calculators/A&P                          Jamaira Stecklein is here with more epigastric abdominal pain.  Started overnight.  Recent alcohol use.  History of gastritis and pancreatitis.  Having some chest pain as well.  Having some nausea but no vomiting.  Patient with normal vitals.  No fever.  Tender in the epigastric region.  Suspect gastritis versus pancreatitis and less likely ACS.  No PE risk factors and PERC negative and doubt PE.  Will give fluid bolus, Zofran, fentanyl and evaluate with blood work including troponin.  EKG shows sinus rhythm.  No ischemic changes.  Troponin normal.  Overall pain mostly in the abdomen.  No concern for ACS.  Pain has been for about 6 hours and with normal troponin and reassuring EKG no need for delta troponin.  No significant anemia, electrolyte ab Mody, kidney injury.  Lipase is normal.  Overall suspect gastritis in the setting of recent alcohol use.  No melena or hematochezia.  Unremarkable hemoglobin.  Given GI cocktail, Protonix with improvement.  Patient already on omeprazole.  We will add Carafate.  Educated about alcohol use.  Discharged in good condition.  This chart was dictated using voice recognition software.  Despite best efforts to proofread,  errors can occur which can change the documentation meaning.    Final Clinical  Impression(s) / ED Diagnoses Final diagnoses:  Epigastric pain    Rx / DC Orders ED Discharge Orders         Ordered    sucralfate (CARAFATE) 1 g tablet  3 times daily with meals & bedtime        07/05/20 0853           Virgina Norfolk, DO 07/05/20 3154

## 2020-09-07 ENCOUNTER — Emergency Department (HOSPITAL_BASED_OUTPATIENT_CLINIC_OR_DEPARTMENT_OTHER)
Admission: EM | Admit: 2020-09-07 | Discharge: 2020-09-07 | Disposition: A | Discharge status: Home / Self Care | Payer: Managed Care, Other (non HMO) | Attending: Emergency Medicine | Admitting: Emergency Medicine

## 2020-09-07 ENCOUNTER — Other Ambulatory Visit: Payer: Self-pay

## 2020-09-07 ENCOUNTER — Encounter (HOSPITAL_BASED_OUTPATIENT_CLINIC_OR_DEPARTMENT_OTHER): Payer: Self-pay | Admitting: Emergency Medicine

## 2020-09-07 ENCOUNTER — Emergency Department (HOSPITAL_BASED_OUTPATIENT_CLINIC_OR_DEPARTMENT_OTHER): Payer: Managed Care, Other (non HMO)

## 2020-09-07 DIAGNOSIS — Z79899 Other long term (current) drug therapy: Secondary | ICD-10-CM | POA: Insufficient documentation

## 2020-09-07 DIAGNOSIS — Z794 Long term (current) use of insulin: Secondary | ICD-10-CM | POA: Diagnosis not present

## 2020-09-07 DIAGNOSIS — E1110 Type 2 diabetes mellitus with ketoacidosis without coma: Secondary | ICD-10-CM | POA: Diagnosis not present

## 2020-09-07 DIAGNOSIS — I10 Essential (primary) hypertension: Secondary | ICD-10-CM | POA: Insufficient documentation

## 2020-09-07 DIAGNOSIS — F17210 Nicotine dependence, cigarettes, uncomplicated: Secondary | ICD-10-CM | POA: Insufficient documentation

## 2020-09-07 DIAGNOSIS — R059 Cough, unspecified: Secondary | ICD-10-CM

## 2020-09-07 DIAGNOSIS — U071 COVID-19: Secondary | ICD-10-CM | POA: Insufficient documentation

## 2020-09-07 DIAGNOSIS — Z7984 Long term (current) use of oral hypoglycemic drugs: Secondary | ICD-10-CM | POA: Insufficient documentation

## 2020-09-07 LAB — SARS CORONAVIRUS 2 (TAT 6-24 HRS): SARS Coronavirus 2: POSITIVE — AB

## 2020-09-07 MED ORDER — BENZONATATE 100 MG PO CAPS: 100.0000 mg | ORAL_CAPSULE | Freq: Three times a day (TID) | ORAL | 0 refills | Status: DC

## 2020-09-07 MED ORDER — ALBUTEROL SULFATE HFA 108 (90 BASE) MCG/ACT IN AERS
2.0000 | INHALATION_SPRAY | Freq: Once | RESPIRATORY_TRACT | Status: AC
  Administered 2020-09-07: 2 via RESPIRATORY_TRACT
  Filled 2020-09-07: qty 6.7

## 2020-09-07 MED ORDER — BENZONATATE 100 MG PO CAPS: 100.0000 mg | ORAL_CAPSULE | Freq: Three times a day (TID) | ORAL | 0 refills | Status: AC

## 2020-09-07 MED ORDER — AZITHROMYCIN 250 MG PO TABS: 250.0000 mg | ORAL_TABLET | Freq: Every day | ORAL | 0 refills | Status: AC

## 2020-09-07 MED ORDER — BENZONATATE 100 MG PO CAPS
200.0000 mg | ORAL_CAPSULE | Freq: Once | ORAL | Status: AC
  Administered 2020-09-07: 200 mg via ORAL
  Filled 2020-09-07: qty 2

## 2020-09-07 MED ORDER — AZITHROMYCIN 250 MG PO TABS: 250.0000 mg | ORAL_TABLET | Freq: Every day | ORAL | 0 refills | Status: DC

## 2020-09-07 NOTE — ED Provider Notes (Signed)
[facility] EMERGENCY DEPARTMENT Provider Note   CSN: [id number] Arrival date & time: 09/07/20  1044     History Chief Complaint  Patient presents with   Cough    Kathleen Cabrera is a 45 y.o. female.  HPI 45 year old female past medical history includes hypertension and diabetes presents to the emergency department today for evaluation of cough.  Patient reports associated sore throat, headache and rhinorrhea.  She reports some chest discomfort with coughing.  Symptoms began 3 weeks ago.  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  Patient reports that her son has allergy type symptoms at home.  Patient reports the cough is productive at times.  She reports some diarrhea.  No vomiting.  She reports chills without fever.  She was taking Mucinex at home for the symptoms.    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     Family History  Problem Relation Age of Onset   Hypertension Mother    Diabetes Mother    Hyperlipidemia Mother    Cancer Father    Hypertension Father    Diabetes Brother    Cancer Maternal Aunt    Hypertension Maternal Grandmother    Hypertension Maternal Grandfather    Stroke Maternal Grandfather    Hypertension Paternal Grandmother    Diabetes Paternal Grandmother    Hypertension Paternal Grandfather    Stroke Paternal Grandfather    Cancer Other     Social  History   Tobacco Use   Smoking status: Every Day    Packs/day: 0.50    Years: 24.00    Pack years: 12.00    Types: Cigarettes   Smokeless tobacco: Never  Vaping Use   Vaping Use: Never used  Substance Use Topics   Alcohol use: Yes   Drug use: No    Home Medications Prior to Admission medications   Medication Sig Start Date End Date Taking? Authorizing Provider  amLODipine (NORVASC) 10 MG tablet Take 1 tablet (10 mg total) by mouth daily. 12/08/17   Robinson, [place] N, PA-C  atorvastatin (LIPITOR) 20 MG tablet Take 20 mg by mouth at bedtime. 07/02/19   [provider]  Blood Gluc Meter Disp-Strips (BLOOD GLUCOSE METER DISPOSABLE) DEVI Inject 1 strip into the skin 3 (three) times daily. 08/21/18   Dorcas Carrow, MD  Blood Glucose Monitoring Suppl Supplies MISC Blood sugar monitoring device to be used 2 or 3 times a day. 08/21/18   Dorcas Carrow, MD  buPROPion (WELLBUTRIN SR) 150 MG 12 hr tablet Take by mouth. 04/01/19   [provider]  clindamycin (CLINDAGEL) 1 % gel Apply topically 2 (two) times daily. 05/28/19   [provider]  hydrOXYzine (ATARAX/VISTARIL) 10 MG tablet Take 1 tablet (10 mg total) by mouth at bedtime as needed for itching. 08/05/19   Ellamae Sia, DO  insulin detemir (LEVEMIR) 100 UNIT/ML injection Inject into the skin daily. Patient not taking: Reported on 08/05/2019    [provider]  Insulin Glargine (LANTUS) 100 UNIT/ML Solostar Pen Inject 24 Units into the skin at bedtime. Patient not taking: Reported on 08/05/2019 08/21/18   Dorcas Carrow, MD  insulin lispro (INSULIN LISPRO) 100 UNIT/ML KwikPen Junior Inject 0.08 mLs (8 Units total) into the skin 3 (three) times daily. Patient not taking: Reported on 08/05/2019 08/21/18   Dorcas Carrow, MD  Insulin Pen Needle (NOVOFINE) 30G X 8 MM MISC Inject 10 each into the skin as needed. Patient not taking: Reported on 08/05/2019 08/21/18   Dorcas Carrow, MD  Lancets (FREESTYLE) lancets 1 each by  Other route 3 (three) times daily. 2-3 times a day 08/21/18   Dorcas Carrow, MD  losartan (COZAAR) 25 MG tablet Take 1 tablet (25 mg total) by mouth daily. 12/08/17   Robinson, [place] N, PA-C  medroxyPROGESTERone (DEPO-PROVERA) 150 MG/ML injection Inject 1 mL into the muscle every 3 (three) months. 06/05/18   [provider]  metFORMIN (GLUCOPHAGE) 500 MG tablet Take 500 mg by mouth 2 (two) times daily. 06/20/19   [provider]  omeprazole (PRILOSEC) 40 MG capsule Take 40 mg by mouth daily. 07/07/19   [provider]  [facility] VERIO test strip daily. 07/17/19   [provider]  promethazine (PHENERGAN) 25 MG tablet Take 25 mg by mouth every 8 (eight) hours as needed for nausea or vomiting.  Patient not taking: Reported on 08/05/2019 08/17/18   [provider]  sucralfate (CARAFATE) 1 g tablet Take 1 tablet (1 g total) by mouth 4 (four) times daily -  with meals and at bedtime for 14 days. 07/05/20 07/19/20  Curatolo, Adam, DO  terbinafine (LAMISIL) 250 MG tablet Take 250 mg by mouth daily. 07/07/19   [provider]  triamcinolone cream (KENALOG) 0.1 % Apply topically 2 (two) times daily. 04/28/19   [provider]  vitamin B-12 (CYANOCOBALAMIN) 100 MCG tablet Take 100 mcg by mouth daily. 07/02/19   [provider]  Vitamin D, Ergocalciferol, (DRISDOL) 1.25 MG (50000 UNIT) CAPS capsule Take 50,000 Units by mouth once a week. 06/30/19   [provider]    Allergies    Chocolate  Review of Systems   Review of Systems  Constitutional:  Positive for chills. Negative for fever.  HENT:  Positive for congestion, rhinorrhea and sore throat.   Eyes:  Negative for discharge.  Respiratory:  Positive for cough.   Cardiovascular:  Negative for palpitations.  Gastrointestinal:  Positive for diarrhea. Negative for abdominal pain, nausea and vomiting.  Musculoskeletal:  Positive for arthralgias and myalgias.  Skin:  Negative for color  change.  Psychiatric/Behavioral:  Negative for confusion.    Physical Exam Updated Vital Signs BP (!) 147/92 (BP Location: Right Arm)   Pulse 99   Temp 98.6 F (37 C) (Oral)   Resp 17   SpO2 100%   Physical Exam Vitals and nursing note reviewed.  Constitutional:      General: She is not in acute distress.    Appearance: She is well-developed. She is not ill-appearing or toxic-appearing.  HENT:     Head: Normocephalic and atraumatic.  Eyes:     General: No scleral icterus.       Right eye: No discharge.        Left eye: No discharge.  Cardiovascular:     Rate and Rhythm: Normal rate and regular rhythm.  Pulses: Normal pulses.     Heart sounds: Normal heart sounds.  Pulmonary:     Effort: Pulmonary effort is normal. No respiratory distress.     Breath sounds: Wheezing and rhonchi present.  Musculoskeletal:        General: Normal range of motion.     Cervical back: Normal range of motion.  Skin:    General: Skin is warm and dry.     Coloration: Skin is not pale.  Neurological:     Mental Status: She is alert.  Psychiatric:        Behavior: Behavior normal.        Thought Content: Thought content normal.        Judgment: Judgment normal.    ED Results / Procedures / Treatments   Labs (all labs ordered are listed, but only abnormal results are displayed) Labs Reviewed  SARS CORONAVIRUS 2 (TAT 6-24 HRS)    EKG None  Radiology DG Chest Port 1 View  Result Date: 09/07/2020 CLINICAL DATA:  Cough, headache EXAM: PORTABLE CHEST 1 VIEW COMPARISON:  07/05/2020 FINDINGS: The heart size and mediastinal contours are within normal limits. No focal airspace consolidation, pleural effusion, or pneumothorax. The visualized skeletal structures are unremarkable. IMPRESSION: No active disease. Electronically Signed   By: Duanne Guess D.O.   On: 09/07/2020 12:14    Procedures Procedures   Medications Ordered in ED Medications  albuterol (VENTOLIN HFA) 108 (90 Base)  MCG/ACT inhaler 2 puff (has no administration in time range)  benzonatate (TESSALON) capsule 200 mg (has no administration in time range)    ED Course  I have reviewed the triage vital signs and the nursing notes.  Pertinent labs & imaging results that were available during my care of the patient were reviewed by me and considered in my medical decision making (see chart for details).    MDM Rules/Calculators/A&P                          45 year old presents for 3 weeks of cough, sore throat, headache, chest wall discomfort with cough, diarrhea.  Patient reports that she has been around sick contacts with similar symptoms.  Patient is overall well-appearing.  Vital signs reassuring.  Patient not tachypneic or hypoxic.  She is afebrile.  Lungs with some rhonchorous sounds bilaterally and intermittent wheezing that clear with cough.  COVID and flu test are pending.  Chest x-ray performed to personally view shows no evidence of cardiac or pulmonary abnormality including focal infiltrate concerning for pneumonia.  Presentation not consistent with ACS PE, dissection.  Suspect that patient symptoms are caused by infection.  Will start patient on Z-Pak along with Tessalon and inhaler.  Patient encouraged to return to the ER for worsening symptoms also encourage primary care follow-up in outpatient setting  Pt is hemodynamically stable, in NAD, & able to ambulate in the ED. Evaluation does not show pathology that would require ongoing emergent intervention or inpatient treatment. I explained the diagnosis to the patient. Pain has been managed & has no complaints prior to dc. Pt is comfortable with above plan and is stable for discharge at this time. All questions were answered prior to disposition. Strict return precautions for f/u to the ED were discussed. Encouraged follow up with PCP.    Final Clinical Impression(s) / ED Diagnoses Final diagnoses:  Cough    Rx / DC Orders ED Discharge Orders  Ordered    azithromycin (ZITHROMAX) 250 MG tablet  Daily,   Status:  Discontinued        09/07/20 1223    benzonatate (TESSALON) 100 MG capsule  Every 8 hours,   Status:  Discontinued        09/07/20 1224    azithromycin (ZITHROMAX) 250 MG tablet  Daily        09/07/20 1227    benzonatate (TESSALON) 100 MG capsule  Every 8 hours        09/07/20 1227             Wallace KellerLeaphart, Xzander Gilham T, PA-C 09/07/20 1324    Koleen DistanceWright, Anna G, MD 09/07/20 [phone]

## 2020-09-07 NOTE — Discharge Instructions (Addendum)
Your x-ray showed no evidence of pneumonia today however given the longevity of your cough with your medical conditions I will treat you with antibiotic for possible small pneumonia not seen on x-ray today.  Take antibiotics as prescribed.  Given you Tessalon which is a cough medication.  Use the inhaler for any wheezing or shortness of breath.  You can use over-the-counter Mucinex as well to help with the decongestion.  Drink plenty of fluids with this.  Make sure to follow-up with your primary care doctor.  Motrin Tylenol for pain and fevers.  Turn to ER any worsening symptoms

## 2020-09-07 NOTE — ED Triage Notes (Signed)
Pt reports cough and headache that started last night. PT reports she has "had  cold" for the last 3 weeks.

## 2021-08-16 ENCOUNTER — Encounter (HOSPITAL_BASED_OUTPATIENT_CLINIC_OR_DEPARTMENT_OTHER): Payer: Self-pay

## 2021-08-16 ENCOUNTER — Other Ambulatory Visit: Payer: Self-pay

## 2021-08-16 ENCOUNTER — Emergency Department (HOSPITAL_BASED_OUTPATIENT_CLINIC_OR_DEPARTMENT_OTHER)
Admission: EM | Admit: 2021-08-16 | Discharge: 2021-08-16 | Disposition: A | Discharge status: Home / Self Care | Payer: Managed Care, Other (non HMO) | Attending: Emergency Medicine | Admitting: Emergency Medicine

## 2021-08-16 DIAGNOSIS — Z79899 Other long term (current) drug therapy: Secondary | ICD-10-CM | POA: Diagnosis not present

## 2021-08-16 DIAGNOSIS — I10 Essential (primary) hypertension: Secondary | ICD-10-CM | POA: Insufficient documentation

## 2021-08-16 DIAGNOSIS — R519 Headache, unspecified: Secondary | ICD-10-CM | POA: Diagnosis present

## 2021-08-16 DIAGNOSIS — J0100 Acute maxillary sinusitis, unspecified: Secondary | ICD-10-CM | POA: Diagnosis not present

## 2021-08-16 MED ORDER — PSEUDOEPHEDRINE HCL ER 120 MG PO TB12: 120.0000 mg | ORAL_TABLET | Freq: Two times a day (BID) | ORAL | 0 refills | Status: AC

## 2021-08-16 MED ORDER — AMOXICILLIN-POT CLAVULANATE 875-125 MG PO TABS: 1.0000 | ORAL_TABLET | Freq: Two times a day (BID) | ORAL | 0 refills | Status: AC

## 2021-08-16 MED ORDER — OXYMETAZOLINE HCL 0.05 % NA SOLN: 1.0000 | Freq: Two times a day (BID) | NASAL | 0 refills | Status: AC

## 2021-08-16 MED ORDER — FLUTICASONE PROPIONATE 50 MCG/ACT NA SUSP: 1.0000 | Freq: Every day | NASAL | 0 refills | Status: AC

## 2021-08-16 NOTE — ED Triage Notes (Signed)
C/o sinus pressure/headache for a few days. Taking OTC without relief.

## 2021-08-16 NOTE — ED Provider Notes (Signed)
[facility] EMERGENCY DEPARTMENT Provider Note   CSN: [id number] Arrival date & time: 08/16/21  1516     History  Chief Complaint  Patient presents with   Facial Pain    Kathleen Cabrera is a 46 y.o. female.  Patient presents with three days of constant sinus pressure (R>L) with yellow rhinorrhea, and intermittent mild to moderate frontal headaches. She has tried multiple OTC medications with minimal to no relief.  Sinus pressure is worse today with mild localized swelling noted upon waking up this morning. Swelling has since resolved. No fevers, cough, sore throat, ear pain, neck pain, CP, SOB, abdominal pain, N/V/D or vision changes. No difficulties eating, drinking or sleeping. She does not have sinus infections frequently.  History of hypertension, well controlled on medications per her history.       Home Medications Prior to Admission medications   Medication Sig Start Date End Date Taking? Authorizing Provider  amLODipine (NORVASC) 10 MG tablet Take 1 tablet (10 mg total) by mouth daily. 12/08/17   Robinson, [place] N, PA-C  atorvastatin (LIPITOR) 20 MG tablet Take 20 mg by mouth at bedtime. 07/02/19   [provider]  azithromycin (ZITHROMAX) 250 MG tablet Take 1 tablet (250 mg total) by mouth daily. Take first 2 tablets together, then 1 every day until finished. 09/07/20   Rise Mu, PA-C  benzonatate (TESSALON) 100 MG capsule Take 1 capsule (100 mg total) by mouth every 8 (eight) hours. 09/07/20   Rise Mu, PA-C  Blood Gluc Meter Disp-Strips (BLOOD GLUCOSE METER DISPOSABLE) DEVI Inject 1 strip into the skin 3 (three) times daily. 08/21/18   Dorcas Carrow, MD  Blood Glucose Monitoring Suppl Supplies MISC Blood sugar monitoring device to be used 2 or 3 times a day. 08/21/18   Dorcas Carrow, MD  buPROPion (WELLBUTRIN SR) 150 MG 12 hr tablet Take by mouth. 04/01/19   [provider]  clindamycin (CLINDAGEL) 1 % gel Apply topically 2 (two)  times daily. 05/28/19   [provider]  hydrOXYzine (ATARAX/VISTARIL) 10 MG tablet Take 1 tablet (10 mg total) by mouth at bedtime as needed for itching. 08/05/19   Ellamae Sia, DO  insulin detemir (LEVEMIR) 100 UNIT/ML injection Inject into the skin daily. Patient not taking: Reported on 08/05/2019    [provider]  Insulin Glargine (LANTUS) 100 UNIT/ML Solostar Pen Inject 24 Units into the skin at bedtime. Patient not taking: Reported on 08/05/2019 08/21/18   Dorcas Carrow, MD  insulin lispro (INSULIN LISPRO) 100 UNIT/ML KwikPen Junior Inject 0.08 mLs (8 Units total) into the skin 3 (three) times daily. Patient not taking: Reported on 08/05/2019 08/21/18   Dorcas Carrow, MD  Insulin Pen Needle (NOVOFINE) 30G X 8 MM MISC Inject 10 each into the skin as needed. Patient not taking: Reported on 08/05/2019 08/21/18   Dorcas Carrow, MD  Lancets (FREESTYLE) lancets 1 each by Other route 3 (three) times daily. 2-3 times a day 08/21/18   Dorcas Carrow, MD  losartan (COZAAR) 25 MG tablet Take 1 tablet (25 mg total) by mouth daily. 12/08/17   Robinson, [place] N, PA-C  medroxyPROGESTERone (DEPO-PROVERA) 150 MG/ML injection Inject 1 mL into the muscle every 3 (three) months. 06/05/18   [provider]  metFORMIN (GLUCOPHAGE) 500 MG tablet Take 500 mg by mouth 2 (two) times daily. 06/20/19   [provider]  omeprazole (PRILOSEC) 40 MG capsule Take 40 mg by mouth daily. 07/07/19   [provider]  Lum Babe  test strip daily. 07/17/19   [provider]  promethazine (PHENERGAN) 25 MG tablet Take 25 mg by mouth every 8 (eight) hours as needed for nausea or vomiting.  Patient not taking: Reported on 08/05/2019 08/17/18   [provider]  sucralfate (CARAFATE) 1 g tablet Take 1 tablet (1 g total) by mouth 4 (four) times daily -  with meals and at bedtime for 14 days. 07/05/20 07/19/20  Curatolo, Adam, DO  terbinafine (LAMISIL) 250 MG tablet Take 250 mg by mouth  daily. 07/07/19   [provider]  triamcinolone cream (KENALOG) 0.1 % Apply topically 2 (two) times daily. 04/28/19   [provider]  vitamin B-12 (CYANOCOBALAMIN) 100 MCG tablet Take 100 mcg by mouth daily. 07/02/19   [provider]  Vitamin D, Ergocalciferol, (DRISDOL) 1.25 MG (50000 UNIT) CAPS capsule Take 50,000 Units by mouth once a week. 06/30/19   [provider]      Allergies    Patient has no active allergies.    Review of Systems   Review of Systems  Physical Exam Updated Vital Signs BP (!) 136/99 (BP Location: Left Arm)   Pulse 99   Temp 98.1 F (36.7 C) (Oral)   Resp 18   Ht 5\' 3"  (1.6 m)   Wt 78.9 kg   SpO2 97%   BMI 30.82 kg/m   Physical Exam Vitals and nursing note reviewed.  Constitutional:      Appearance: She is well-developed.  HENT:     Head: Normocephalic and atraumatic.     Jaw: No trismus.     Right Ear: Tympanic membrane, ear canal and external ear normal.     Left Ear: Tympanic membrane, ear canal and external ear normal.     Nose: Mucosal edema present. No rhinorrhea.     Right Turbinates: Enlarged.     Left Turbinates: Enlarged.     Right Sinus: Maxillary sinus tenderness present.     Left Sinus: Maxillary sinus tenderness present.     Mouth/Throat:     Mouth: Mucous membranes are moist. Mucous membranes are not dry. No oral lesions.     Pharynx: Uvula midline. No oropharyngeal exudate, posterior oropharyngeal erythema or uvula swelling.     Tonsils: No tonsillar abscesses.  Eyes:     General:        Right eye: No discharge.        Left eye: No discharge.     Conjunctiva/sclera: Conjunctivae normal.  Cardiovascular:     Rate and Rhythm: Normal rate and regular rhythm.     Heart sounds: Normal heart sounds.  Pulmonary:     Effort: Pulmonary effort is normal. No respiratory distress.     Breath sounds: Normal breath sounds. No wheezing or rales.  Abdominal:     Palpations: Abdomen is soft.      Tenderness: There is no abdominal tenderness.  Musculoskeletal:     Cervical back: Normal range of motion and neck supple.  Lymphadenopathy:     Cervical: No cervical adenopathy.  Skin:    General: Skin is warm and dry.  Neurological:     Mental Status: She is alert.  Psychiatric:        Mood and Affect: Mood normal.     ED Results / Procedures / Treatments   Labs (all labs ordered are listed, but only abnormal results are displayed) Labs Reviewed - No data to display  EKG None  Radiology No results found.  Procedures Procedures  Medications Ordered in ED Medications - No data to display  ED Course/ Medical Decision Making/ A&P    Patient seen and examined. History obtained directly from patient.   Labs/EKG: None.  Imaging: None.  Medications/Fluids: None ordered.   Most recent vital signs reviewed and are as follows: BP (!) 136/99 (BP Location: Left Arm)   Pulse 99   Temp 98.1 F (36.7 C) (Oral)   Resp 18   Ht 5\' 3"  (1.6 m)   Wt 78.9 kg   SpO2 97%   BMI 30.82 kg/m   Initial impression: Right-sided maxillary sinusitis  Home treatment plan: Encourage patient to continue treatment such as oxymetazoline, Flonase, Sudafed over the next several days.  If symptoms not improved by Monday (7 total days of illness) or if she develops a fever in the interim, start Augmentin and follow-up with PCP.  Return instructions discussed with patient: Encouraged return with worsening symptoms or other concerns.  Follow-up instructions discussed with patient: Encouraged follow-up with worsening or changing symptoms, or she feels antibiotic.                           Medical Decision Making  Patient with symptoms consistent with a viral syndrome/sinusitis.   Patient has only had symptoms for 48 to 72 hours.  Encouraged symptomatic treatment at this point.  Vitals are stable, no fever. No signs of dehydration. Lung exam normal, no signs of pneumonia. Supportive therapy  indicated with return if symptoms worsen.           Final Clinical Impression(s) / ED Diagnoses Final diagnoses:  Acute non-recurrent maxillary sinusitis    Rx / DC Orders ED Discharge Orders          Ordered    pseudoephedrine (SUDAFED 12 HOUR) 120 MG 12 hr tablet  2 times daily        08/16/21 1728    oxymetazoline (AFRIN NASAL SPRAY) 0.05 % nasal spray  2 times daily        08/16/21 1728    fluticasone (FLONASE) 50 MCG/ACT nasal spray  Daily        08/16/21 1728    amoxicillin-clavulanate (AUGMENTIN) 875-125 MG tablet  Every 12 hours        08/16/21 1728              10/17/21, PA-C 08/16/21 1736    10/17/21, MD 08/18/21 1348

## 2021-08-16 NOTE — ED Notes (Signed)
Upon DC, pt appears in no distress, when questioned regards to pain, discomfort, states her pain is rated as a 10 on 0-10 scale, described as pressure and aching. AVS provided to client, informed of the medication that was ordered electronically to the pharmacy listed on her EMR, also provided a written Rx for abx and informed that per ED PA, do not start abx until Monday if still not feeling well. Work note also provided by ED PA. Opportunity for questions provided prior to DC to home

## 2021-08-16 NOTE — Discharge Instructions (Signed)
Please read and follow all provided instructions.  Your diagnoses today include:  1. Acute non-recurrent maxillary sinusitis     You appear to have an upper respiratory infection (URI). An upper respiratory tract infection, or cold, is a viral infection of the air passages leading to the lungs. It should improve gradually after 5-7 days. You may have a lingering cough that lasts for 2- 4 weeks after the infection.  Tests performed today include: Vital signs. See below for your results today.   Medications prescribed:  Pseudoephedrine - decongestant medication to help with nasal congestion  Oxymetazoline - nasal spray for congestion. Do not use for more than 3 days because this medicine can cause rebound congestion.    Take any prescribed medications only as directed. Treatment for your infection is aimed at treating the symptoms. There are no medications, such as antibiotics, that will cure your infection.   Home care instructions:  You can take Tylenol and/or Ibuprofen as directed on the packaging for fever reduction and pain relief.    For cough: honey 1/2 to 1 teaspoon (you can dilute the honey in water or another fluid).  You can also use guaifenesin and dextromethorphan for cough. You can use a humidifier for chest congestion and cough.  If you don't have a humidifier, you can sit in the bathroom with the hot shower running.      For sore throat: try warm salt water gargles, cepacol lozenges, throat spray, warm tea or water with lemon/honey, popsicles or ice, or OTC cold relief medicine for throat discomfort.    For congestion: take a daily anti-histamine like Zyrtec, Claritin, and a oral decongestant, such as pseudoephedrine.  You can also use Flonase 1-2 sprays in each nostril daily.    It is important to stay hydrated: drink plenty of fluids (water, gatorade/powerade/pedialyte, juices, or teas) to keep your throat moisturized and help further relieve irritation/discomfort.    Your illness is contagious and can be spread to others, especially during the first 3 or 4 days. It cannot be cured by antibiotics or other medicines. Take basic precautions such as washing your hands often, covering your mouth when you cough or sneeze, and avoiding public places where you could spread your illness to others.   Please continue drinking plenty of fluids.  Use over-the-counter medicines as needed as directed on packaging for symptom relief.  You may also use ibuprofen or tylenol as directed on packaging for pain or fever.  Do not take multiple medicines containing Tylenol or acetaminophen to avoid taking too much of this medication.  Follow-up instructions: Please follow-up with your primary care provider in the next 3 days for further evaluation of your symptoms if you are not feeling better.   Return instructions:  Please return to the Emergency Department if you experience worsening symptoms.  RETURN IMMEDIATELY IF you develop shortness of breath, confusion or altered mental status, a new rash, become dizzy, faint, or poorly responsive, or are unable to be cared for at home. Please return if you have persistent vomiting and cannot keep down fluids or develop a fever that is not controlled by tylenol or motrin.   Please return if you have any other emergent concerns.  Additional Information:  Your vital signs today were: BP (!) 136/99 (BP Location: Left Arm)   Pulse 99   Temp 98.1 F (36.7 C) (Oral)   Resp 18   Ht 5\' 3"  (1.6 m)   Wt 78.9 kg   SpO2 97%  BMI 30.82 kg/m  If your blood pressure (BP) was elevated above 135/85 this visit, please have this repeated by your doctor within one month. --------------

## 2021-11-23 IMAGING — DX DG CHEST 1V PORT
1 series · 1 of 1 positions shown · non-contrast
Comparison: None.

CLINICAL DATA: Epigastric chest pain that began at 3 a.m. today

EXAM:
PORTABLE CHEST 1 VIEW

[chest ap]
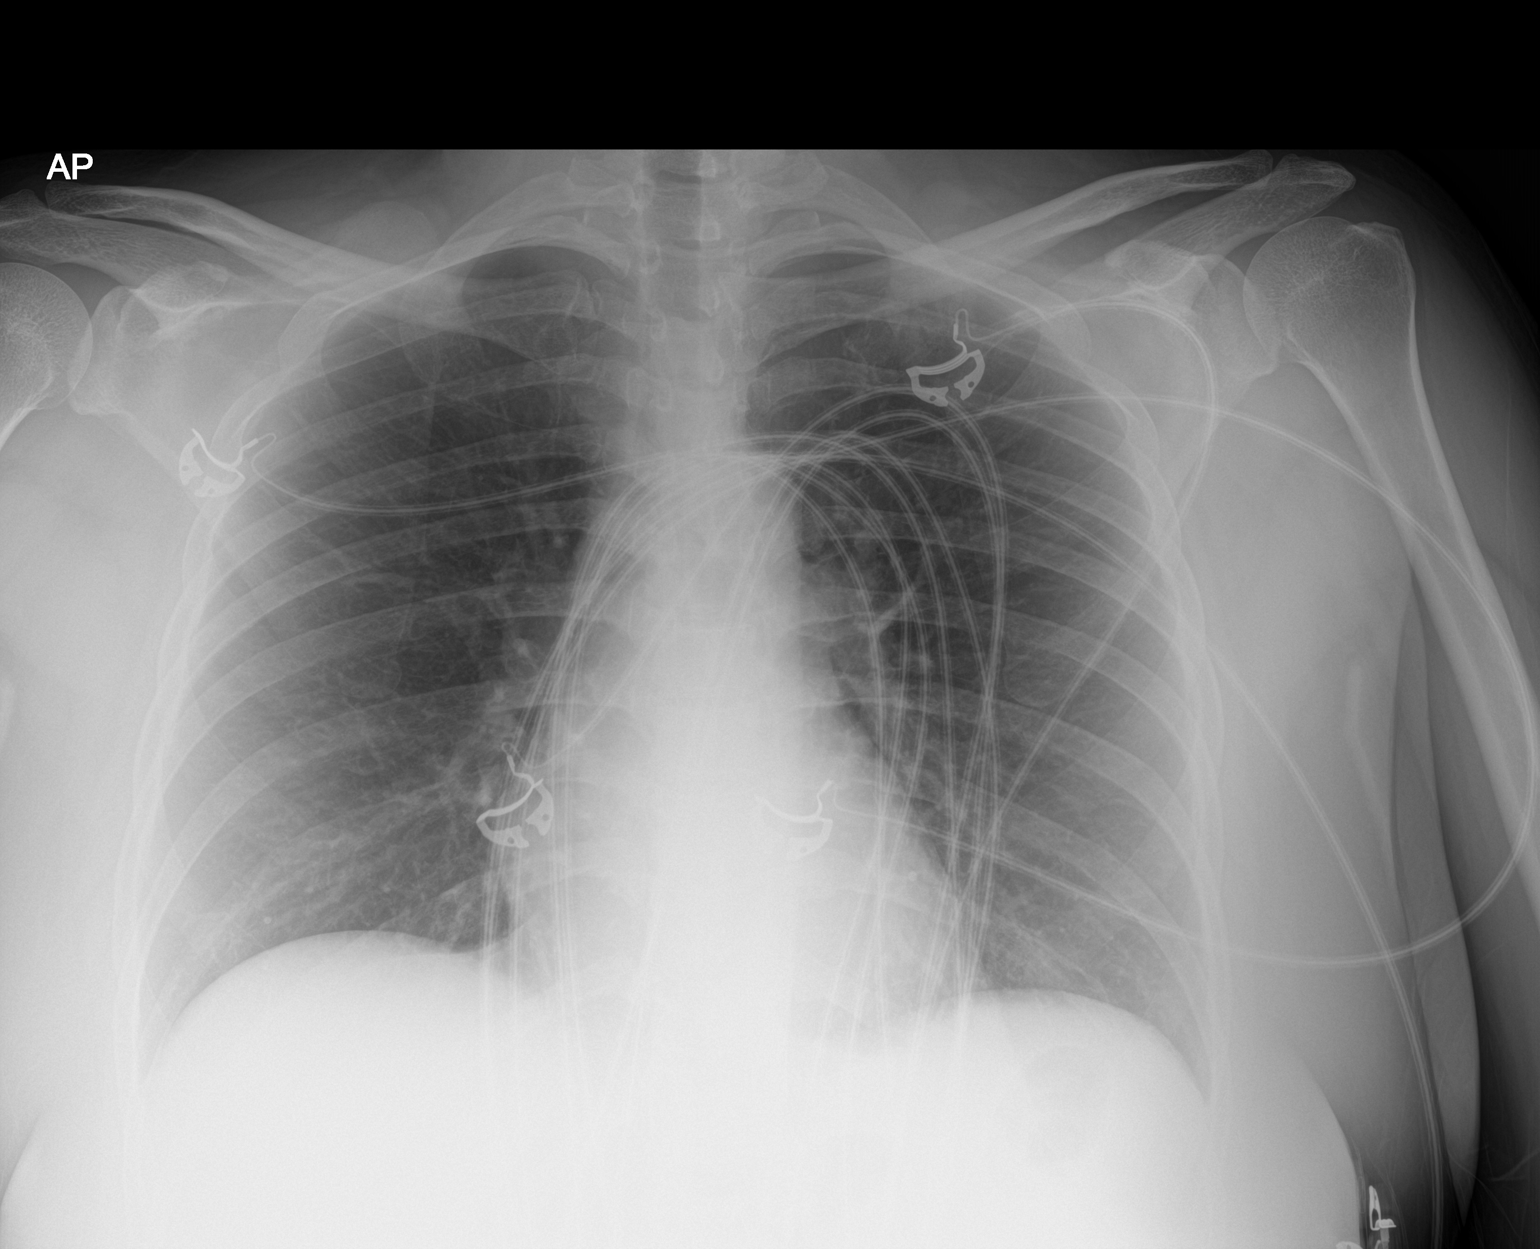

[1 of 1 positions shown; findings below may reference images not displayed]

FINDINGS: The heart size and mediastinal contours are within normal limits.
Both lungs are clear. The visualized skeletal structures are
unremarkable.
IMPRESSION: No active disease.

## 2022-01-26 IMAGING — DX DG CHEST 1V PORT
1 series · 1 of 1 positions shown · non-contrast
Comparison: 07/05/2020

CLINICAL DATA: Cough, headache

EXAM:
PORTABLE CHEST 1 VIEW

[chest ap]
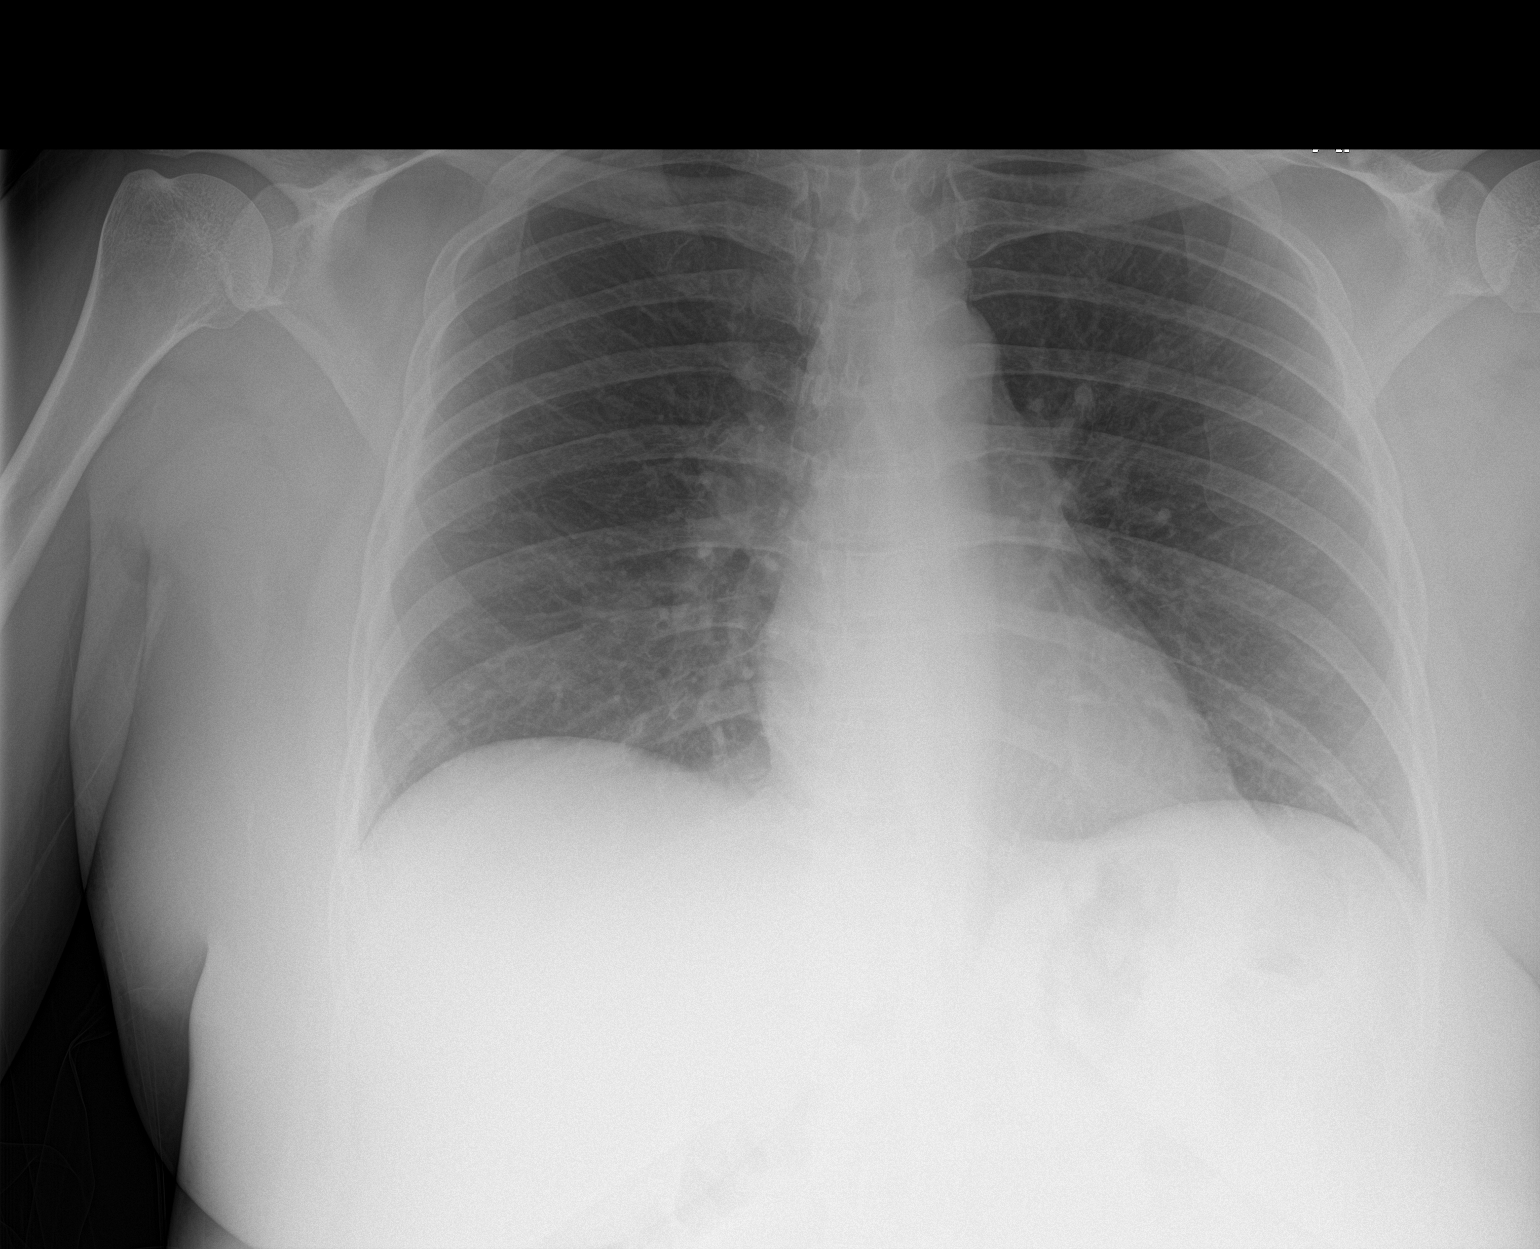

[1 of 1 positions shown; findings below may reference images not displayed]

FINDINGS: The heart size and mediastinal contours are within normal limits. No
focal airspace consolidation, pleural effusion, or pneumothorax. The
visualized skeletal structures are unremarkable.
IMPRESSION: No active disease.
